# Patient Record
Sex: Male | Born: 1980 | Race: Black or African American | Hispanic: No | Marital: Single | Smoking: Current some day smoker
Health system: Southern US, Community
[De-identification: ages and names within clinical notes are randomized; demographics above are authoritative.]

## PROBLEM LIST (undated history)

## (undated) DIAGNOSIS — F329 Major depressive disorder, single episode, unspecified: Secondary | ICD-10-CM

## (undated) DIAGNOSIS — L732 Hidradenitis suppurativa: Secondary | ICD-10-CM

## (undated) DIAGNOSIS — IMO0002 Reserved for concepts with insufficient information to code with codable children: Secondary | ICD-10-CM

## (undated) DIAGNOSIS — I82409 Acute embolism and thrombosis of unspecified deep veins of unspecified lower extremity: Secondary | ICD-10-CM

## (undated) DIAGNOSIS — R569 Unspecified convulsions: Secondary | ICD-10-CM

## (undated) DIAGNOSIS — F32A Depression, unspecified: Secondary | ICD-10-CM

## (undated) DIAGNOSIS — I739 Peripheral vascular disease, unspecified: Secondary | ICD-10-CM

## (undated) HISTORY — PX: KIDNEY DONATION: SHX685

## (undated) HISTORY — PX: OTHER SURGICAL HISTORY: SHX169

## (undated) HISTORY — PX: TONSILLECTOMY: SUR1361

---
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
## 2003-03-29 HISTORY — PX: KIDNEY SURGERY: SHX687

## 2005-06-28 ENCOUNTER — Emergency Department (HOSPITAL_COMMUNITY): Admission: EM | Admit: 2005-06-28 | Discharge: 2005-06-28 | Payer: Self-pay | Admitting: *Deleted

## 2006-12-02 ENCOUNTER — Ambulatory Visit: Payer: Self-pay | Admitting: Surgery

## 2006-12-02 ENCOUNTER — Inpatient Hospital Stay (HOSPITAL_COMMUNITY): Admission: EM | Admit: 2006-12-02 | Discharge: 2006-12-05 | Payer: Self-pay | Admitting: Emergency Medicine

## 2006-12-04 ENCOUNTER — Ambulatory Visit: Payer: Self-pay | Admitting: Hematology & Oncology

## 2006-12-05 ENCOUNTER — Encounter: Payer: Self-pay | Admitting: Critical Care Medicine

## 2007-03-10 ENCOUNTER — Ambulatory Visit: Payer: Self-pay | Admitting: Critical Care Medicine

## 2007-03-10 DIAGNOSIS — I82409 Acute embolism and thrombosis of unspecified deep veins of unspecified lower extremity: Secondary | ICD-10-CM | POA: Insufficient documentation

## 2008-02-21 ENCOUNTER — Emergency Department (HOSPITAL_COMMUNITY): Admission: EM | Admit: 2008-02-21 | Discharge: 2008-02-21 | Payer: Self-pay | Admitting: Emergency Medicine

## 2008-05-30 ENCOUNTER — Emergency Department (HOSPITAL_COMMUNITY): Admission: EM | Admit: 2008-05-30 | Discharge: 2008-05-30 | Payer: Self-pay | Admitting: Emergency Medicine

## 2009-07-10 ENCOUNTER — Emergency Department (HOSPITAL_COMMUNITY): Admission: EM | Admit: 2009-07-10 | Discharge: 2009-07-10 | Payer: Self-pay | Admitting: Emergency Medicine

## 2010-05-23 LAB — RPR: RPR Ser Ql: NONREACTIVE

## 2010-05-28 LAB — URINALYSIS, ROUTINE W REFLEX MICROSCOPIC
Glucose, UA: NEGATIVE mg/dL
Hgb urine dipstick: NEGATIVE
Ketones, ur: 15 mg/dL — AB
Nitrite: NEGATIVE
Specific Gravity, Urine: 1.041 — ABNORMAL HIGH (ref 1.005–1.030)
Urobilinogen, UA: 1 mg/dL (ref 0.0–1.0)
pH: 6 (ref 5.0–8.0)

## 2010-05-28 LAB — GC/CHLAMYDIA PROBE AMP, GENITAL
Chlamydia, DNA Probe: POSITIVE — AB
GC Probe Amp, Genital: POSITIVE — AB

## 2010-05-28 LAB — URINE CULTURE
Colony Count: NO GROWTH
Culture: NO GROWTH

## 2010-05-28 LAB — URINE MICROSCOPIC-ADD ON

## 2010-05-28 LAB — RPR: RPR Ser Ql: NONREACTIVE

## 2010-06-26 NOTE — H&P (Signed)
NAMECHATHAM, HOWINGTON             ACCOUNT NO.:  [id number]   MEDICAL RECORD NO.:  [id number]          PATIENT TYPE:  EMS   LOCATION:  ED                           FACILITY:  [facility]   PHYSICIAN:  Wilson Singer, M.D.DATE OF BIRTH:  02/03/1981   DATE OF ADMISSION:  12/02/2006  DATE OF DISCHARGE:                              HISTORY & PHYSICAL   The patient is unassigned.   HISTORY:  This is a 30 year old man who has no other medical problems  who noticed swelling and pain in the left leg in the last 24-36 hours.  He denies taking any long journeys on coaches or airline flights and  denies being immobile for long periods of time recently.  He was  discovered to have a left leg deep vein thrombosis and now he is being  admitted for the above.   PAST MEDICAL HISTORY:  No serious illnesses.   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   SOCIAL HISTORY:  He is single and lives with his sisters.  He smokes one  to two cigarettes per day.  He does not drink alcohol.  He is currently  looking for job.   MEDICATIONS:  None.   ALLERGIES:  IODINE.   FAMILY HISTORY:  Noncontributory.   PHYSICAL EXAMINATION:  VITAL SIGNS:  Temperature 101.5 on admission but  now 100.4, blood pressure 148/90, pulse 90, respiratory rate 12,  saturation 99%.  CARDIOVASCULAR:  Heart sounds are present and normal.  There are no murmurs.  RESPIRATORY:  Lung fields are clear.  There is no pleural rub.  ABDOMEN:  Soft and nontender with no hepatosplenomegaly.  I cannot  detect any masses.  NEUROLOGICAL:  He is alert and oriented with no focal neurologic signs.  Examination of his left leg confirms a swollen left leg and warmth on  palpation consistent with the finding of a deep vein thrombosis on  venous Doppler.   INVESTIGATIONS:  Hemoglobin 12.3, white blood cell  count 12.8, platelets  209.  Sodium 135, potassium 3.7, chloride 101, bicarbonate 27, glucose  111, BUN 8, creatinine 1.19, INR 1.2, PTT 30.  Left lower extremity  venous Doppler shows a DVT in the femoral vein and peroneal vein.   IMPRESSION:  Left leg deep venous thrombosis.   PLAN:  1. Admit.  2. Anticoagulate with Lovenox and Coumadin.  3. CT of the abdomen and pelvis to make sure there are no masses      obstructing venous flow.  4. Check hypercoagulopathy panel.   Further recommendations will depend on the patient's hospital progress.      Wilson Singer, M.D.  Electronically Signed     NCG/MEDQ  D:  12/02/2006  T:  12/02/2006  Job:  [id number]

## 2010-06-26 NOTE — Discharge Summary (Signed)
NAMEJALONI, Adam Christian             ACCOUNT NO.:  [id number]   MEDICAL RECORD NO.:  [id number]          PATIENT TYPE:  INP   LOCATION:  1428                         FACILITY:  [facility]   PHYSICIAN:  Lonia Blood, M.D.       DATE OF BIRTH:  Feb 27, 1980   DATE OF ADMISSION:  12/02/2006  DATE OF DISCHARGE:  12/05/2006                               DISCHARGE SUMMARY   PRIMARY CARE PHYSICIAN:  Will become [facility].   DISCHARGE DIAGNOSES:  1. Left femoral vein and peroneal vein deep venous thrombosis.  2. Phlegmasia alba dolens.  3. Decreased level of protein C of unclear significance.  4. 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   DISCHARGE MEDICATIONS:  1. Rivaroxaban, a study medication for anticoagulation purposes.  2. OxyContin sustained release 10 mg twice a day  3. Percocet 5/325 one to two tablets every 6 hours for breakthrough      pain.  4. Senokot twice daily for constipation   CONDITION ON DISCHARGE:  Adam Christian was discharged in fair condition.  At the time of discharge, the patient was told to follow up with  [facility].  The patient has a visit with Dr. Myna Hidalgo,  hematology/oncology, for December 31, 2006, at 10:30 a.m. to discuss the  length of anticoagulation.  The patient was also enrolled EINSTEIN study  by Dr. Shan Levans.   PROCEDURES DURING THIS ADMISSION:  December 02, 2006, the patient  underwent lower extremity venous Dopplers. Findings positive for deep  venous thrombosis in the mid femoral vein down to the popliteal and  peroneal vein.   CONSULTATIONS:  No consultations obtained.   HISTORY AND PHYSICAL:  Refer to the dictated H&P done by Dr. Karilyn Cota on  December 02, 2006.   HOSPITAL COURSE.:  LEFT LOWER EXTREMITY DEEP VENOUS THROMBOSIS:  Adam Christian was admitted  and promptly placed on anticoagulation with Lovenox.  The patient had  significant pain in his left lower extremity, which we felt was  secondary to  phlegmasia alba dolens.  The patient received symptomatic  treatment with opiates and local care.  On December 03, 2006, the patient  was randomized in the EINSTEIN trial of Rivaroxaban anticoagulant for  deep venous  thrombosis.  The patient will receive the study medication for free, and  he will have followup arranged through the study.  Adam Christian was seen  by the physical therapist and occupational therapist, and he was deemed  stable for discharge by December 05, 2006.      Lonia Blood, M.D.  Electronically Signed     SL/MEDQ  D:  12/05/2006  T:  12/06/2006  Job:  [id number]   cc:   Rose Phi. Myna Hidalgo, M.D.  Fax: [phone]   Charlcie Cradle Delford Field, MD, FCCP  [address]  [place]  [place] [address]

## 2010-11-21 LAB — URINALYSIS, ROUTINE W REFLEX MICROSCOPIC
Bilirubin Urine: NEGATIVE
Hgb urine dipstick: NEGATIVE
Specific Gravity, Urine: 1.02
pH: 7.5

## 2010-11-21 LAB — BETA-2-GLYCOPROTEIN I ABS, IGG/M/A: Beta-2 Glyco I IgG: <4 U/mL (ref <20)

## 2010-11-21 LAB — COMPREHENSIVE METABOLIC PANEL
ALT: 17
ALT: 19
AST: 14
Albumin: 2.7 — ABNORMAL LOW
Alkaline Phosphatase: 48
Alkaline Phosphatase: 53
BUN: 6
Calcium: 8.6
Calcium: 9.2
GFR calc Af Amer: >60
Glucose, Bld: 103 — ABNORMAL HIGH
Potassium: 4.1
Potassium: 4.5
Sodium: 133 — ABNORMAL LOW
Sodium: 138
Total Protein: 5.9 — ABNORMAL LOW
Total Protein: 6.8

## 2010-11-21 LAB — BASIC METABOLIC PANEL
BUN: 8
Calcium: 8.8
Chloride: 101
Creatinine, Ser: 1.19
GFR calc Af Amer: >60
GFR calc non Af Amer: >60
Glucose, Bld: 111 — ABNORMAL HIGH

## 2010-11-21 LAB — PROTIME-INR
INR: 1.2
INR: 1.2
Prothrombin Time: 15.2
Prothrombin Time: 15.2

## 2010-11-21 LAB — LUPUS ANTICOAGULANT PANEL
Lupus Anticoagulant: DETECTED — AB
PTT Lupus Anticoagulant: 55.8 — ABNORMAL HIGH (ref 36.3–48.8)

## 2010-11-21 LAB — CBC
HCT: 38.3 — ABNORMAL LOW
HCT: 39.3
HCT: 41.7
Hemoglobin: 12.3 — ABNORMAL LOW
Hemoglobin: 13.1
MCHC: 32.7
MCHC: 33
MCHC: 33.3
MCV: 82.4
MCV: 82.8
Platelets: 194
Platelets: 209
Platelets: 254
Platelets: 286
RBC: 4.77
RDW: 13.5
RDW: 13.6
RDW: 13.9
WBC: 12.8 — ABNORMAL HIGH
WBC: 13.1 — ABNORMAL HIGH

## 2010-11-21 LAB — DIFFERENTIAL
Basophils Absolute: 0
Eosinophils Absolute: 0.1
Eosinophils Relative: 0
Lymphs Abs: 1.6
Monocytes Absolute: 0.9 — ABNORMAL HIGH
Neutro Abs: 10.3 — ABNORMAL HIGH
Neutrophils Relative %: 81 — ABNORMAL HIGH

## 2010-11-21 LAB — PROTEIN S, TOTAL: Protein S Ag, Total: 119 % (ref 70–140)

## 2010-11-21 LAB — ANTITHROMBIN III: AntiThromb III Func: 99 (ref 76–126)

## 2010-11-21 LAB — CARDIOLIPIN ANTIBODIES, IGG, IGM, IGA
Anticardiolipin IgA: <7 — ABNORMAL LOW (ref <13)
Anticardiolipin IgM: <7 — ABNORMAL LOW (ref <10)

## 2010-11-21 LAB — PROTEIN C ACTIVITY: Protein C Activity: 86 % (ref 75–133)

## 2010-11-21 LAB — PROTEIN S ACTIVITY: Protein S Activity: 111 % (ref 69–129)

## 2010-11-21 LAB — HOMOCYSTEINE: Homocysteine: 6.3

## 2010-11-21 LAB — HEPATIC FUNCTION PANEL: Indirect Bilirubin: 0.6

## 2010-11-21 LAB — PROTHROMBIN GENE MUTATION

## 2010-11-21 LAB — FACTOR 5 LEIDEN

## 2010-11-21 LAB — PROTEIN C, TOTAL: Protein C, Total: 51 % — ABNORMAL LOW (ref 70–140)

## 2010-11-21 LAB — APTT: aPTT: 30

## 2010-12-19 ENCOUNTER — Encounter: Payer: Self-pay | Admitting: *Deleted

## 2010-12-19 ENCOUNTER — Emergency Department (HOSPITAL_COMMUNITY)
Admission: EM | Admit: 2010-12-19 | Discharge: 2010-12-20 | Disposition: A | Discharge status: Other Acute Inpatient Hospital | Payer: Self-pay | Attending: Emergency Medicine | Admitting: Emergency Medicine

## 2010-12-19 DIAGNOSIS — R45851 Suicidal ideations: Secondary | ICD-10-CM | POA: Insufficient documentation

## 2010-12-19 DIAGNOSIS — F329 Major depressive disorder, single episode, unspecified: Secondary | ICD-10-CM | POA: Insufficient documentation

## 2010-12-19 DIAGNOSIS — F3289 Other specified depressive episodes: Secondary | ICD-10-CM | POA: Insufficient documentation

## 2010-12-19 DIAGNOSIS — F17200 Nicotine dependence, unspecified, uncomplicated: Secondary | ICD-10-CM | POA: Insufficient documentation

## 2010-12-19 HISTORY — DX: Depression, unspecified: F32.A

## 2010-12-19 HISTORY — DX: Major depressive disorder, single episode, unspecified: F32.9

## 2010-12-19 LAB — COMPREHENSIVE METABOLIC PANEL
Albumin: 3.1 g/dL — ABNORMAL LOW (ref 3.5–5.2)
Alkaline Phosphatase: 76 U/L (ref 39–117)
BUN: 9 mg/dL (ref 6–23)
CO2: 28 mEq/L (ref 19–32)
Chloride: 100 mEq/L (ref 96–112)
Potassium: 3.6 mEq/L (ref 3.5–5.1)
Total Bilirubin: 0.1 mg/dL — ABNORMAL LOW (ref 0.3–1.2)

## 2010-12-19 LAB — RAPID URINE DRUG SCREEN, HOSP PERFORMED
Benzodiazepines: NOT DETECTED
Cocaine: NOT DETECTED
Opiates: NOT DETECTED

## 2010-12-19 LAB — CBC
MCH: 25.9 pg — ABNORMAL LOW (ref 26.0–34.0)
MCV: 81 fL (ref 78.0–100.0)
Platelets: 296 10*3/uL (ref 150–400)
RDW: 14.3 % (ref 11.5–15.5)
WBC: 11.1 10*3/uL — ABNORMAL HIGH (ref 4.0–10.5)

## 2010-12-19 LAB — ACETAMINOPHEN LEVEL: Acetaminophen (Tylenol), Serum: <15 ug/mL (ref 10–30)

## 2010-12-19 MED ORDER — IBUPROFEN 200 MG PO TABS: 400.0000 mg | ORAL_TABLET | Freq: Three times a day (TID) | ORAL | Status: DC | PRN

## 2010-12-19 MED ORDER — ONDANSETRON HCL 4 MG PO TABS: 4.0000 mg | ORAL_TABLET | Freq: Three times a day (TID) | ORAL | Status: DC | PRN

## 2010-12-19 MED ORDER — ZOLPIDEM TARTRATE 5 MG PO TABS
5.0000 mg | ORAL_TABLET | Freq: Every evening | ORAL | Status: DC | PRN
  Administered 2010-12-19: 5 mg via ORAL
  Filled 2010-12-19: qty 1

## 2010-12-19 MED ORDER — NICOTINE 21 MG/24HR TD PT24: 21.0000 mg | MEDICATED_PATCH | Freq: Every day | TRANSDERMAL | Status: DC | PRN

## 2010-12-19 MED ORDER — ALUM & MAG HYDROXIDE-SIMETH 200-200-20 MG/5ML PO SUSP: 30.0000 mL | ORAL | Status: DC | PRN

## 2010-12-19 MED ORDER — ACETAMINOPHEN 325 MG PO TABS: 650.0000 mg | ORAL_TABLET | ORAL | Status: DC | PRN

## 2010-12-19 MED ORDER — LORAZEPAM 1 MG PO TABS: 1.0000 mg | ORAL_TABLET | Freq: Three times a day (TID) | ORAL | Status: DC | PRN

## 2010-12-19 NOTE — ED Notes (Signed)
Attempted to call act team no answer

## 2010-12-19 NOTE — ED Provider Notes (Addendum)
History     CSN: [id number] Arrival date & time: 12/19/2010  5:15 PM     Chief Complaint  Patient presents with  . V70.1    HPI Pt was seen at 1740.  Per pt, c/o gradual onset and worsening of persistent depression and SI for the past 2 weeks. Pt states he "wanted to step out in front of a car" in [place] today, but did not.  He states he called [facility] who brought him to the ED for further eval/admit.  Denies HI, no SA, no hallucinations.     Past Medical History  Diagnosis Date  . Depression     Past Surgical History  Procedure Date  . Kidney donation   . Tonsillectomy      History  Substance Use Topics  . Smoking status: Current Everyday Smoker  . Smokeless tobacco: Not on file  . Alcohol Use: No    Review of Systems ROS: Statement: All systems negative except as marked or noted in the HPI; Constitutional: Negative for fever and chills. ; ; Eyes: Negative for eye pain, redness and discharge. ; ; ENMT: Negative for ear pain, hoarseness, nasal congestion, sinus pressure and sore throat. ; ; Cardiovascular: Negative for chest pain, palpitations, diaphoresis, dyspnea and peripheral edema. ; ; Respiratory: Negative for cough, wheezing and stridor. ; ; Gastrointestinal: Negative for nausea, vomiting, diarrhea and abdominal pain, blood in stool, hematemesis, jaundice and rectal bleeding. . ; ; Genitourinary: Negative for dysuria, flank pain and hematuria. ; ; Musculoskeletal: Negative for back pain and neck pain. Negative for swelling and trauma.; ; Skin: Negative for pruritus, rash, abrasions, blisters, bruising and skin lesion.; ; Neuro: Negative for headache, lightheadedness and neck stiffness. Negative for weakness, altered level of consciousness , altered mental status, extremity weakness, paresthesias, involuntary movement, seizure and syncope.  Psych:  +SI, no SA, no HI, no hallucinations.  Allergies  Iodine and Shrimp  Home Medications  No current outpatient  prescriptions on file.  BP 128/82  Pulse 61  Temp(Src) 97.6 F (36.4 C) (Oral)  Resp 18  Wt 161 lb 13.1 oz (73.4 kg)  SpO2 100%  Physical Exam 1745: Physical examination:  Nursing notes reviewed; Vital signs and O2 SAT reviewed;  Constitutional: Well developed, Well nourished, Well hydrated, In no acute distress; Head:  Normocephalic, atraumatic; Eyes: EOMI, PERRL, No scleral icterus; ENMT: Mouth and pharynx normal, Mucous membranes moist; Neck: Supple, Full range of motion, No lymphadenopathy; Cardiovascular: Regular rate and rhythm, No murmur, rub, or gallop; Respiratory: Breath sounds clear & equal bilaterally, No rales, rhonchi, wheezes, or rub, Normal respiratory effort/excursion; Chest: Nontender, Movement normal; Extremities: Pulses normal, No tenderness, No edema, No calf edema or asymmetry.; Neuro: AA&Ox3, Major CN grossly intact.  No gross focal motor or sensory deficits in extremities.; Skin: Color normal, Warm, Dry; Psych:  Affect flat, poor eye contact, +SI.    ED Course  Procedures   MDM  MDM Reviewed: nursing note and vitals Interpretation: labs   Results for orders placed during the hospital encounter of 12/19/10  URINE RAPID DRUG SCREEN (HOSP PERFORMED)      Component Value Range   Opiates NONE DETECTED  NONE DETECTED    Cocaine NONE DETECTED  NONE DETECTED    Benzodiazepines NONE DETECTED  NONE DETECTED    Amphetamines NONE DETECTED  NONE DETECTED    Tetrahydrocannabinol POSITIVE (*) NONE DETECTED    Barbiturates NONE DETECTED  NONE DETECTED   ACETAMINOPHEN LEVEL      Component  Value Range   Acetaminophen (Tylenol), Serum <15.0  10 - 30 (ug/mL)  ETHANOL      Component Value Range   Alcohol, Ethyl (B) <11  0 - 11 (mg/dL)  COMPREHENSIVE METABOLIC PANEL      Component Value Range   Sodium 137  135 - 145 (mEq/L)   Potassium 3.6  3.5 - 5.1 (mEq/L)   Chloride 100  96 - 112 (mEq/L)   CO2 28  19 - 32 (mEq/L)   Glucose, Bld 88  70 - 99 (mg/dL)   BUN 9  6 - 23  (mg/dL)   Creatinine, Ser 9.56  0.50 - 1.35 (mg/dL)   Calcium 8.9  8.4 - 21.3 (mg/dL)   Total Protein 7.8  6.0 - 8.3 (g/dL)   Albumin 3.1 (*) 3.5 - 5.2 (g/dL)   AST 12  0 - 37 (U/L)   ALT 12  0 - 53 (U/L)   Alkaline Phosphatase 76  39 - 117 (U/L)   Total Bilirubin 0.1 (*) 0.3 - 1.2 (mg/dL)   GFR calc non Af Amer >90  >90 (mL/min)   GFR calc Af Amer >90  >90 (mL/min)  CBC      Component Value Range   WBC 11.1 (*) 4.0 - 10.5 (K/uL)   RBC 4.59  4.22 - 5.81 (MIL/uL)   Hemoglobin 11.9 (*) 13.0 - 17.0 (g/dL)   HCT 08.6 (*) 57.8 - 52.0 (%)   MCV 81.0  78.0 - 100.0 (fL)   MCH 25.9 (*) 26.0 - 34.0 (pg)   MCHC 32.0  30.0 - 36.0 (g/dL)   RDW 46.9  62.9 - 52.8 (%)   Platelets 296  150 - 400 (K/uL)    1900:  T/C to ACT team, case discussed, including:  HPI, pertinent PM/SHx, VS/PE, dx testing, ED course and treatment.  Agreeable to eval in ED.        [facility]    Laray Anger, DO 12/20/10 0208  Pt stable overnight, awaiting assessment by ACT.  Olivia Mackie, MD 12/20/10 [phone]

## 2010-12-19 NOTE — ED Notes (Signed)
Report given to Nicole.

## 2010-12-19 NOTE — ED Notes (Signed)
Luciano Cutter Act made aware of consult

## 2010-12-19 NOTE — ED Notes (Signed)
Patient was given a sprite and sandwich

## 2010-12-19 NOTE — ED Notes (Signed)
Patient given a sprite

## 2010-12-19 NOTE — Progress Notes (Addendum)
Assessment Note   Adam Christian is an 30 y.o. male. Pt was brought to the ED by [facility]. Pt reports feeling suicidal with a plan to step out in traffic. He expresses feeling depressed, hopeless, worthless, guilt, loss of interest in the things that use to make him happy, and problems sleeping. Pt has one past SI attempt by OD.  He has no history of inpatient hospitalizations. Pt denies HI AVH. Pt is unable to contract for safety at this time.  Patient does not have a current provider and is no seeing anyone for treatment at this time.  Patient current stressors include a recent break-up with his girlfriend who kicked him out of the home which forced him to move back in with his mother. Patient reports having a lot of friends, yet still feeling alone. He reports not having any direction in life and wants to give up ant times. He describes his SI thoughts as strong frustrating thoughts that have increased over the last week. Patient reports having this feelings since he was 30 years old. He is a current user of Marijuana and smokes 2 blunts per day for years with last use today.   Patient is seeking inpatient treatment for stabilization and is willing to go voluntary. This Clinical research associate spoke with the EDP who agrees that patient needs inpatient treatment to prevent any harm done to self. The writer has sent referral to [facility] for review. All parties have been notified of the disposition as it stands at this time.   Disposition: Please RUN   Axis I: Depressive D/O NOS Axis II: Deferred Axis III: History reviewed. No pertinent past medical history. Axis IV: economic problems, problems related to social environment and problems with primary support group Axis V: 31-40 impairment in reality testing  Past Medical History: History reviewed. No pertinent past medical history.  Past Surgical History  Procedure Date  . Kidney donation   . Tonsillectomy     Family History: History reviewed. No pertinent  family history.  Social History:  reports that he has been smoking.  He does not have any smokeless tobacco history on file. He reports that he uses illicit drugs (Marijuana). He reports that he does not drink alcohol.  Allergies:  Allergies  Allergen Reactions  . Iodine   . Shrimp (Shellfish Allergy)     Home Medications:  Medications Prior to Admission  Medication Dose Route Frequency Provider Last Rate Last Dose  . acetaminophen (TYLENOL) tablet 650 mg  650 mg Oral Q4H PRN Laray Anger, DO      . alum & mag hydroxide-simeth (MAALOX/MYLANTA) 200-200-20 MG/5ML suspension 30 mL  30 mL Oral PRN Laray Anger, DO      . ibuprofen (ADVIL,MOTRIN) tablet 400 mg  400 mg Oral Q8H PRN Laray Anger, DO      . LORazepam (ATIVAN) tablet 1 mg  1 mg Oral Q8H PRN Laray Anger, DO      . nicotine (NICODERM CQ - dosed in mg/24 hours) patch 21 mg  21 mg Transdermal Daily PRN Laray Anger, DO      . ondansetron [facility]) tablet 4 mg  4 mg Oral Q8H PRN Laray Anger, DO      . zolpidem [facility]) tablet 5 mg  5 mg Oral QHS PRN Laray Anger, DO   5 mg at 12/19/10 2236   No current outpatient prescriptions on file as of 12/19/2010.    OB/GYN Status:  No LMP for  male patient.  General Assessment Data Living Arrangements: Parent (Lives w/ mother) Can pt return to current living arrangement?: Yes Admission Status: Voluntary Is patient capable of signing voluntary admission?: Yes Transfer from: Home Referral Source: MD  Risk to self Suicidal Ideation: Yes-Currently Present Suicidal Intent: Yes-Currently Present Is patient at risk for suicide?: Yes Suicidal Plan?: Yes-Currently Present (Walk into traffic) Specify Current Suicidal Plan:  (Walk into traffic) Access to Means: Yes Specify Access to Suicidal Means: yes What has been your use of drugs/alcohol within the last 12 months?:  (use drugs daily) Other Self Harm Risks: none Triggers for Past Attempts:  Unknown Intentional Self Injurious Behavior: None Factors that decrease suicide risk: Sense of responsibility to family Family Suicide History: No Recent stressful life event(s): Other (Comment) (recent breakup w/ ex-girlfriend, no job/income) Persecutory voices/beliefs?: No Depression: Yes Depression Symptoms: Loss of interest in usual pleasures;Feeling worthless/self pity;Feeling angry/irritable;Guilt Substance abuse history and/or treatment for substance abuse?: Yes Suicide prevention information given to non-admitted patients: Not applicable  Risk to Others Homicidal Ideation: No Thoughts of Harm to Others: No Current Homicidal Intent: No Current Homicidal Plan: No Access to Homicidal Means: No Identified Victim: none History of harm to others?: No Assessment of Violence: None Noted Violent Behavior Description: none Does patient have access to weapons?: No Criminal Charges Pending?: No Does patient have a court date: No  Mental Status Report Appear/Hygiene: Other (Comment) (appropriate) Eye Contact: Fair Motor Activity: Freedom of movement Speech: Logical/coherent Level of Consciousness: Alert Mood: Depressed Affect: Depressed Anxiety Level: Minimal Thought Processes: Coherent;Relevant Judgement: Impaired Orientation: Person;Place;Time;Situation Obsessive Compulsive Thoughts/Behaviors: None  Cognitive Functioning Concentration: Normal Memory: Recent Intact;Remote Intact IQ: Average Insight: Poor Impulse Control: Fair Appetite: Good Weight Loss:  (none) Weight Gain:  (none) Sleep: Decreased Total Hours of Sleep: 5  Vegetative Symptoms: None  Prior Inpatient/Outpatient Therapy Prior Therapy Dates: n/a Prior Therapy Facilty/Provider(s): n/a Reason for Treatment: n/a            Values / Beliefs Cultural Requests During Hospitalization: None Spiritual Requests During Hospitalization: None        Additional Information 1:1 In Past 12 Months?:  No CIRT Risk: No Elopement Risk: No Does patient have medical clearance?: Yes  Child/Adolescent Assessment Running Away Risk: Denies Bed-Wetting: Denies Destruction of Property: Denies Cruelty to Animals: Denies Stealing: Denies Rebellious/Defies Authority: Denies Satanic Involvement: Denies Archivist: Denies Problems at [employer]: Denies Gang Involvement: Denies  Disposition:  Disposition Disposition of Patient: Referred to [facility]) Patient referred to: Other (Comment)  On Site Evaluation by:   Reviewed with Physician:     Shara Blazing [facility] 12/19/2010 11:03 PM  Patient has been accepted to [facility] to Dr. Allena Katz, Bed 508-1.  Patient's support paperwork has been completed. EDP notified and is in agreement with disposition. EDP will discharge pt to [facility]. Pt nurse notified as well. ALL appropriate paperwork completed and forwarded to [facility] for review.   Ileene Hutchinson , MSW, LCSWA 12/20/2010  7:57 AM

## 2010-12-19 NOTE — ED Notes (Signed)
Pt reports that he has had thoughts of suicide since the age of 74 reports that over the past 2wks worsening thoughts. Pt reports that he spoke with his sisters 2wks ago when he thought of stepping out into traffic pt reports that he had those same thoughts today at the bus stop and called mobile crisis, reports that she brought him here to be evaluated and is currently looking for placement

## 2010-12-19 NOTE — ED Notes (Signed)
Pt states "I've always had thoughts of hurting myself, but in the last 2 wks have talked to my family about it, called the Crisis Team today, I would jump out in front of a car or something"; pt denies hearing voices, Crisis Team member sitting with pt.

## 2010-12-19 NOTE — ED Notes (Signed)
One bag locked in activity room 

## 2010-12-20 ENCOUNTER — Encounter (HOSPITAL_COMMUNITY): Payer: Self-pay | Admitting: Emergency Medicine

## 2010-12-20 ENCOUNTER — Inpatient Hospital Stay (HOSPITAL_COMMUNITY)
Admission: AD | Admit: 2010-12-20 | Discharge: 2010-12-24 | DRG: 885 | Disposition: A | Discharge status: Home / Self Care | Payer: PRIVATE HEALTH INSURANCE | Source: Ambulatory Visit | Attending: Psychiatry | Admitting: Psychiatry

## 2010-12-20 ENCOUNTER — Encounter (HOSPITAL_COMMUNITY): Payer: Self-pay | Admitting: Behavioral Health

## 2010-12-20 DIAGNOSIS — R45851 Suicidal ideations: Secondary | ICD-10-CM

## 2010-12-20 DIAGNOSIS — IMO0002 Reserved for concepts with insufficient information to code with codable children: Secondary | ICD-10-CM

## 2010-12-20 DIAGNOSIS — Z91013 Allergy to seafood: Secondary | ICD-10-CM

## 2010-12-20 DIAGNOSIS — F411 Generalized anxiety disorder: Secondary | ICD-10-CM

## 2010-12-20 DIAGNOSIS — F329 Major depressive disorder, single episode, unspecified: Principal | ICD-10-CM

## 2010-12-20 DIAGNOSIS — G4700 Insomnia, unspecified: Secondary | ICD-10-CM

## 2010-12-20 HISTORY — DX: Reserved for concepts with insufficient information to code with codable children: IMO0002

## 2010-12-20 LAB — CBC
HCT: 36.3 % — ABNORMAL LOW (ref 39.0–52.0)
Hemoglobin: 11.6 g/dL — ABNORMAL LOW (ref 13.0–17.0)
MCH: 25.7 pg — ABNORMAL LOW (ref 26.0–34.0)
MCHC: 32 g/dL (ref 30.0–36.0)
MCV: 80.3 fL (ref 78.0–100.0)
RDW: 14.5 % (ref 11.5–15.5)

## 2010-12-20 MED ORDER — CITALOPRAM HYDROBROMIDE 20 MG PO TABS
20.0000 mg | ORAL_TABLET | Freq: Every day | ORAL | Status: DC
  Administered 2010-12-20 – 2010-12-23 (×4): 20 mg via ORAL
  Filled 2010-12-20 (×5): qty 1

## 2010-12-20 MED ORDER — ALUM & MAG HYDROXIDE-SIMETH 200-200-20 MG/5ML PO SUSP: 30.0000 mL | ORAL | Status: DC | PRN

## 2010-12-20 MED ORDER — DOXYCYCLINE HYCLATE 100 MG PO TABS
100.0000 mg | ORAL_TABLET | Freq: Two times a day (BID) | ORAL | Status: DC
  Administered 2010-12-20 – 2010-12-24 (×8): 100 mg via ORAL
  Filled 2010-12-20 (×9): qty 1

## 2010-12-20 MED ORDER — MAGNESIUM HYDROXIDE 400 MG/5ML PO SUSP: 30.0000 mL | Freq: Every day | ORAL | Status: DC | PRN

## 2010-12-20 MED ORDER — TRAZODONE HCL 50 MG PO TABS: 50.0000 mg | ORAL_TABLET | Freq: Every evening | ORAL | Status: DC | PRN

## 2010-12-20 MED ORDER — ACETAMINOPHEN 325 MG PO TABS
650.0000 mg | ORAL_TABLET | Freq: Four times a day (QID) | ORAL | Status: DC | PRN
Start: 2010-12-20 — End: 2010-12-24

## 2010-12-20 MED ORDER — DIPHENHYDRAMINE HCL 25 MG PO CAPS
50.0000 mg | ORAL_CAPSULE | Freq: Every evening | ORAL | Status: DC | PRN
  Administered 2010-12-20 – 2010-12-23 (×4): 50 mg via ORAL

## 2010-12-20 NOTE — ED Notes (Signed)
Assumed care of pt. From night shift. 

## 2010-12-20 NOTE — ED Notes (Signed)
Environment secured.pt asleep, resting comfortably. Will continue to monitor for safety. 

## 2010-12-20 NOTE — ED Notes (Signed)
BP 112/70  Pulse 76  Temp(Src) 97.8 F (36.6 C) (Oral)  Resp 18  Wt 161 lb 13.1 oz (73.4 kg)  SpO2 96%  Delayed entry.  Pt resting comfortably this morning. No complaints. Accepted at [facility]. Dr. Allena Katz.   Forbes Cellar, MD 12/20/10 1139

## 2010-12-20 NOTE — Progress Notes (Addendum)
Psychiatric Admission Assessment Adult  Patient Identification:  Adam Christian Date of Evaluation:  12/20/2010 Chief Complaint:  DEPRESSIVE D/O NOS History of Present Illness:: 30yoSAAM who called mobile crisis for recurrent thoughts to step into traffic. Recently told his family how alone he usually feels despite having 9 sisters. He has had no attempts or gestures but has vividly seen himself steeping off a curb into traffic. Was actually at the bus stop with his mom the other day and because the feeling was so strong he called the mobile crisis unit.  Mood Symptoms:  Depression Depression Symptoms:  depressed mood, insomnia, feelings of worthlessness/guilt and suicidal thoughts with specific plan (Hypo) Manic Symptoms:   Elevated Mood:  No Irritable Mood:  No Grandiosity:  No Distractibility:  No Lability of Mood:  Yesaccording to him  Delusions:  No Hallucinations:  No Impulsivity:  No Sexually Inappropriate Behavior:  No Financial Extravagance:  No Flight of Ideas:  No  Anxiety Symptoms: Excessive Worry:  No Panic Symptoms:  No Agoraphobia:  No Obsessive Compulsive: No  Symptoms: None Specific Phobias:  No Social Anxiety:  No  Psychotic Symptoms:  Hallucinations:  None Delusions:  No Paranoia:  No   Ideas of Reference:  No  PTSD Symptoms: Ever had a traumatic exposure:  No Had a traumatic exposure in the last month:  No Re-experiencing:  None Hypervigilance:  No Hyperarousal:  Sleep None Avoidance:  None  Traumatic Brain Injury:  None   Past Psychiatric History: Diagnosis:seen for add ages [age] in [place]   Hospitalizations:none for psych   2005 [facility] donated a Kidney to his mom  Do not see a scar   Outpatient Care:just Ritalin ages [age]   Substance Abuse Care:none   Self-Mutilation:none   Suicidal Attempts:none none   Violent Behaviors:   Past Medical History:  Cyst L axillae is draining and we took a C&S to rule out MRSA Past Medical History    Diagnosis Date  . Depression   . Cyst 30 years old was first occurance    Pt has multiple cysts: L axila, groing, gluteous, scrotum   History of Loss of Consciousness:  No Seizure History:  Yes age 70 once no meds  Cardiac History:  No Allergies:  NKDA allergic to shrimp  Allergies  Allergen Reactions  . Iodine   . Shrimp (Shellfish Allergy) Swelling    Tongue swells   Current Medications:  Current Facility-Administered Medications  Medication Dose Route Frequency Provider Last Rate Last Dose  . acetaminophen (TYLENOL) tablet 650 mg  650 mg Oral Q6H PRN Franchot Gallo, MD      . alum & mag hydroxide-simeth (MAALOX/MYLANTA) 200-200-20 MG/5ML suspension 30 mL  30 mL Oral Q4H PRN Franchot Gallo, MD      . citalopram (CELEXA) tablet 20 mg  20 mg Oral QHS Mickie D. Adams, PA      . diphenhydrAMINE (BENADRYL) capsule 50 mg  50 mg Oral QHS PRN Mickie D. Adams, PA      . doxycycline (VIBRA-TABS) tablet 100 mg  100 mg Oral Q12H Mechele Dawley, NP   100 mg at 12/20/10 1449  . magnesium hydroxide (MILK OF MAGNESIA) suspension 30 mL  30 mL Oral Daily PRN Franchot Gallo, MD      . DISCONTD: traZODone (DESYREL) tablet 50 mg  50 mg Oral QHS PRN Franchot Gallo, MD       Facility-Administered Medications Ordered in Other Encounters  Medication Dose Route Frequency Provider Last Rate Last Dose  .  DISCONTD: acetaminophen (TYLENOL) tablet 650 mg  650 mg Oral Q4H PRN Laray Anger, DO      . DISCONTD: alum & mag hydroxide-simeth (MAALOX/MYLANTA) 200-200-20 MG/5ML suspension 30 mL  30 mL Oral PRN Laray Anger, DO      . DISCONTD: ibuprofen (ADVIL,MOTRIN) tablet 400 mg  400 mg Oral Q8H PRN Laray Anger, DO      . DISCONTD: LORazepam (ATIVAN) tablet 1 mg  1 mg Oral Q8H PRN Laray Anger, DO      . DISCONTD: nicotine (NICODERM CQ - dosed in mg/24 hours) patch 21 mg  21 mg Transdermal Daily PRN Laray Anger, DO      . DISCONTD: ondansetron [facility]) tablet 4 mg  4 mg Oral Q8H PRN  Laray Anger, DO      . DISCONTD: zolpidem (AMBIEN) tablet 5 mg  5 mg Oral QHS PRN Laray Anger, DO   5 mg at 12/19/10 2236    Previous Psychotropic Medications:  Medication Dose  Took Ritalin age [age]                       Substance Abuse History in the last 12 months: Substance Age of 1st Use Last Use Amount Specific Type  Nicotine      Alcohol      Cannabis  Yesterday  BID   Opiates      Cocaine      Methamphetamines      LSD      Ecstasy      Benzodiazepines      Caffeine      Inhalants      Others:                         Medical Consequences of Substance Abuse:none   Legal Consequences of Substance Abuse:none   Family Consequences of Substance Abuse:none   Blackouts:  No DT's:  No Withdrawal Symptoms:  None  Social History: Current Place of Residence: Living with his mom sister her BF and baby   Place of Birth:   Family Members: Marital Status:  Single Children:  Sons:  Daughters: Relationships: Education:  At [employer] third semester for [employer] Problems/Performance: Religious Beliefs/Practices: History of Abuse (Emotional/Phsycial/Sexual)none  Teacher, music History:  None  Legal History:denies  Hobbies/Interests:  Family History:   Family History  Problem Relation Age of Onset  . Kidney failure Mother     Mental Status Examination/Evaluation: Objective:  Appearance: Fairly Groomed  Patent attorney::  Good  Speech:  Clear and Coherent  Volume:  Normal  Mood:  He reports frequents mood changes during the day   Affect:  Full Range  Thought Process:  Linear  Orientation:  Full  Thought Content:  No psychosis  Suicidal Thoughts:  Yes.  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 Depression   . Cyst 30 years old was  first occurance    Pt has multiple cysts: L axila, groing, gluteous, scrotum   Anaemia and elevated WBC with multiple cysts?   AXIS IV economic problems and educational problems  AXIS V 51-60 moderate symptoms   Treatment Plan/Recommendations:  Treatment Plan Summary: Daily contact with patient to assess and evaluate symptoms and progress in treatment Medication management Start Celexa 20 mg at hs for depression/sleep            Benadryl 50 mg at hs sleep pt is light sleeper and has poor sleep habits  Patient was examined and agree with findings in ED  UDS +THC CBC showed anemia and elevated WBC  Observation Level/Precautions:  15 min checks   Laboratory:  C&S for MRSA is pending -drainage from L axillae   Psychotherapy:  Group therapy   Medications: Celexa 20 mg at hs and Benedryl 50 mg at hs    Routine PRN Medications:  Yes  Consultations: none    Discharge Concerns:    Other:  Will need counselling and med management     ADAMS,MICKIE D. 11/8/20126:37 PM

## 2010-12-20 NOTE — ED Notes (Signed)
Pt accepted to [facility] per [place] act reports that pt can go after 8am today

## 2010-12-20 NOTE — Progress Notes (Addendum)
This is the first admission for this 30 yr old male admitted voluntarily with suicidal ideation. Pt had a plan to step in front of a vehicle. Pt denies SI/HI at this time, but states that he has had passive SI since he was a teenager. Pt does contract for safety. Pt affect is bright and mood is appropriate. Pt offered food and ate lunch. Pt has a hx of marijuana use but no ETOH. Pt states that he recently broke up with his girlfriend and had to move in with his mother. Pt placed on orange precautions due to open abscesses on scrotum, left axilla and gluteus. Pt has had cysts since he was 30 yrs old. Infection control nurse notified. Pt was oriented to the unit and given pt handbook. 15 minute checks initiated for safety.  Writer contacted infection control nurse for instruction in dealing with pt's cysts. Writer received order from MD for wound culture and other labs. Clean dry dressing applied to prevent drainage. According to infection control, pt can participate in milieu as long as wound is covered and prevents drainage from seeping through clothing. MHT washed clothes and pt showered before writer applied dressing to left arm/axilla.

## 2010-12-20 NOTE — ED Notes (Signed)
Called report to Minerva Areola, Charity fundraiser at [facility].   Pt. Is vol. Called [facility] to transport pt. And 1 bag of belongings to [facility].   Pt. gv

## 2010-12-21 DIAGNOSIS — F339 Major depressive disorder, recurrent, unspecified: Secondary | ICD-10-CM

## 2010-12-21 DIAGNOSIS — F411 Generalized anxiety disorder: Secondary | ICD-10-CM

## 2010-12-21 NOTE — Progress Notes (Signed)
[facility] Group Notes:  (Counselor/Nursing/MHT/Case Management/Adjunct)  12/21/2010 3:11 PM  Type of Therapy:  Exploration  Participation Level:  Active  Participation Quality:  Appropriate, Attentive and Sharing  Affect:  Blunted  Cognitive:  Alert and Appropriate  Insight:  Good  Engagement in Group:  Good  Engagement in Therapy:  Good  Modes of Intervention:  Support and Exploration  Summary of Progress/Problems: Adam Christian explored concept of relapse and identified that he is trying not to relapse into being angry all the time. He was able to identify that his family being "argumentative," his history of engaging in fights, and his way of thinking all put him at risk of relapsing into this, but was also able to identify positive ways to cope with the anger, such as taking a walk around his neighborhood and listening to music. Adam Christian also processed his risk factors for suicide as well as protective factors, before being called out of group to meet with the doctor.  Billie Lade 12/21/2010, 3:11 PM

## 2010-12-21 NOTE — Progress Notes (Signed)
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 Appetite: Yes, AEB: Good Appetite.   Mild>(1-10) >Severe  Hopelessness (1-10): 3  Depression (1-10): 3-4 currently.  Reports 8-9 prior to admission. Anxiety (1-10): 2-3   Suicidal Ideation: Denies current suicidal ideations but were present prior to admission.  Plan: No  Intent: No  Means: No  Homicidal Ideation: Adamantly denies any Homicidal Ideations.  Plan: No  Intent: No  Means: No  Mental Status: AO x 3 General Appearance /Behavior: Neat and Casual  Eye Contact: Good Motor Behavior: Normal  Speech: Normal  Level of Consciousness: Alert  Mood: Mild to Moderately Depressed today.  Affect: Mildly Constricted.  Anxiety Mild Anxiety Reported.  Thought Process: Coherent  Thought Content: WNL with no auditory or visual hallucinations or delusional thinking.  Perception: Normal  Judgment: Good  Insight: Good.  Cognition: Orientation time, place and person   PLAN OF CARE:  Treatment Plan Summary:  1. Daily contact with patient to assess and evaluate symptoms and progress in treatment  2. Medication management  3. The patient will deny suicidal ideations for 48 hours prior to discharge and have a depression and anxiety rating of 3 or less.   Plan:  1. Continue current medications.  2. Continue to monitor.   SUICIDE RISK:   Mild:  Suicidal ideation of limited frequency, intensity, duration, and specificity.  There are no identifiable plans, no associated intent, mild dysphoria and related symptoms, good self-control (both objective and subjective assessment), few other risk factors, and  identifiable protective factors, including available and accessible social support.   Adam Christian 12/21/2010, 1:28 PM

## 2010-12-21 NOTE — Progress Notes (Addendum)
  Pt. Affect appropriate and pt. Interactive with staff and peers. Pt. Reports no pain and denies SI/HI and reports no A/V hallucinations.  Pt. Cont. On q 15 min. Observations for safety and is safe at this time. Wound area underneath Left armpit, cont. To be open, red, and draining small amount of fluid.  Gauze reapplied with tape and curlex to stabilize.  Pt. Reports "no Pain" and states he is trying to "keep it clean, and covered.  Pt. Is compliant with precautions and is cooperative with dressing change. Support offered and pt. Verbalizing appreciation.

## 2010-12-21 NOTE — Plan of Care (Signed)
Problem: Consults Goal: Concurrent Medical Patient Education (See Patient Education Module for education specifics)  Pt. Has boils under left arm that have yellowish colored drainage.   Problem: [facility] Concurrent Medical Problem Goal: STG-Vital signs will be within defined limits or stabilized (STG- Vital signs will be within defined limits or stabilized for individual)  VS to be taken as physician order indicates   Comments:  Boils under pt. Left arm has been cultured, pending labs. Per report labs also in scrotal area.

## 2010-12-21 NOTE — Progress Notes (Signed)
Counselor contacted Adam Christian's sister, Gloris Manchester, to provide suicide prevention education. Sister reported that she is currently at work and will not be able to discuss this matter until late this evening. Since there will not be a counselor available at that time when she visits Adam Christian, counselor briefly explained that a suicide prevention pamphlet along with the contact number for mobile crisis unit at [facility] will be left for her at the front desk when she comes to visit tonight. Counselor encouraged Traci to read the pamphlet and call tomorrow to discuss or if she has any questions. Counselor also went over suicide risks, warning signs, and crisis contact information with [place]. He understands his risk factors and who to contact if he experiences warning signs.   Billie Lade

## 2010-12-21 NOTE — Progress Notes (Signed)
  Pt. Goes to [employer]. Pt. Reports that he has been lonely since ago 17. "I have nine sisters and I always tired please everybody, keep a smiling face, but I was lonely." "I can be in a room full of people and still be lonely"  "I think what brought me to this thinking was I finally thought I found someone I could be with and she broke up with me about two months ago." I was in school and use to help her with her kids using my student money." "I was in school for [employer], but I didn't do well in my last two course trying to help her, I missed classes" Pt. Denies SHI, no pain. Pt. Has boils under left arm draining, writer redressed. Staff will continue to monitor q24min for safety.

## 2010-12-21 NOTE — Progress Notes (Addendum)
Patient seen during d/c planning group and treatment team. He reports feeling depressed and unsafe prior to admission and contacting mobile crisis.  He currently advised of doing well and denies SI/HI and rates depression at three/four. He plans to return to his mother's home at discharge and will be referred to [facility] for outpatient follow up.  Patient will discharge to his mother's home.

## 2010-12-21 NOTE — Progress Notes (Signed)
Interdisciplinary Treatment Plan Update (Adult)  Date:  12/21/2010 Time Reviewed:  10:30 AM  Progress in Treatment: Attending groups: Yes. Participating in groups:  Yes. and As evidenced by:  openness in aftercare planning group setting. Taking medication as prescribed:  Yes. Tolerating medication:  Yes. Family/Significant othe contact made:  No, will contact:  sister, Traci Patient understands diagnosis:  No, unaware of diagnosis for his symptoms Discussing patient identified problems/goals with staff:  Yes. and As evidenced by:  exploration of goals with counselor Medical problems stabilized or resolved:  Yes. Denies suicidal/homicidal ideation: Yes. Issues/concerns per patient self-inventory:  No. Other:  New problem(s) identified: No, Describe:  Pt has just arrived - identifying initial goals.  Reason for Continuation of Hospitalization: Anxiety Depression Medication stabilization Other; describe feelings of chronic loneliness  Interventions implemented related to continuation of hospitalization:  Treatment in process and psychoeducational groups, medication evaluation and adjustment, aftercare planning and follow up appointments to be made, and safety checks every 15 minutes.  Additional comments:  Estimated length of stay: 3-5 days  Discharge Plan: Able to return to mother's home and follow up in [facility] to be determined by [place] and Sports coach.  New goal(s): None  Review of initial/current patient goals per problem list:   1.  Goal(s): Demonstrate decreased signs of depression  Met:  No  Target date: by discharge   As evidenced by: [name] reporting decrease of depression from 12 to 4  2.  Goal (s): Reduce/eliminate suicidal thoughts  Met:  Yes  Target date: by discharge  As evidenced by: [name] reports no suicidal thoughts today  3.  Goal(s): Understand relationship between substance (marijuana) use and depresion  Met:  No  Target date: by  discharge  As evidenced by: [name] acknowledging the role of marijuana use in his struggles.   Attendees: Patient:  Adam Christian 11/9/201210:30 AM  Family:   11/9/201210:30 AM  Physician:  Franchot Gallo, MD 11/9/201210:30 AM  Nursing:   Manuela Schwartz, RN 11/9/201210:30 AM  Case Manager:  Juline Patch, LCSW 11/9/201210:30 AM  Counselor:  Angus Palms, LCSW 11/9/201210:30 AM  Other:  Landry Corporal, NP 11/9/201210:30 AM  Other:   11/9/201210:30 AM  Other:   11/9/201210:30 AM  Other:   11/9/201210:30 AM   Scribe for Treatment Team:   Billie Lade, 12/21/2010, 10:30 AM

## 2010-12-22 NOTE — Progress Notes (Signed)
Pt. Attended and participated in aftercare planning group. Suicide prevention information was provided to pt as well as warning signs to look for with suicide and crisis line numbers to use. Pt. Was very attentive in aftercare planning group and interested in the Suicide Prevention information. Pt. Was admitted to [facility] yesterday 12/21/10. Pt. Stated that he is at [facility] for SI and that he used the mobile crisis hotline before coming to the hospital. Pt. Stated that so far his medications are working for him.

## 2010-12-22 NOTE — Progress Notes (Signed)
  Adam Christian is up and about on the adult milieu. He states he is doing well. Sleep has been good, appetite good. Depression 3 (1-10) , Anxiety 2 (1-10). Hopelessness 0. Having no mood swings on Celexa. Sleeping well with Benadryl.  Denies any suicidal or homiical thoughts Denies any psychotic symptoms.  Reports no pain or increase in drainage from abscesses. Keeping it covered.  Plan: Continue with Celexa           Continue with Doxyclycline            Will need f/u for wound care

## 2010-12-22 NOTE — Progress Notes (Signed)
Nursing Note: Pt's wound was changed twice today. Needed to be changed after playing basketball. Area under left arm pit has several boils one that is draining. (please see skin assessment). Has attended the groups and interacts with his peers appropriately. Also states that he has some areas on his scrotum, which was not observed by this Clinical research associate. Denies SI and HI. Given support, reassurance and praise.

## 2010-12-22 NOTE — Progress Notes (Signed)
  Report received from Edison Simon RN. Writer entered pt's room and observed pt lying in bed asleep eyes closed resp. Even and unlabored, no signs of distress noted. Safety maintained on unit, will continue to monitor.

## 2010-12-23 LAB — CULTURE, ROUTINE-ABSCESS: Gram Stain: NONE SEEN

## 2010-12-23 NOTE — Progress Notes (Signed)
[facility] Group Notes:  (Counselor/Nursing/MHT/Case Management/Adjunct)  12/23/2010 3:37 PM  Type of Therapy:  Counseling group  Participation Level:  Active  Participation Quality:  Appropriate  Affect:  Appropriate  Cognitive:  Appropriate  Insight:  Good  Engagement in Group:  Good  Engagement in Therapy:  Good  Modes of Intervention:  Clarification, Problem-solving and Socialization  Summary of Progress/Problems: Pt. participated in group discussion on how identify goals needed to make positive changes. Support systems and how they influence their goals for change were discussed during the group. Pt. Spoke about getting rid of his anger and about communication problems with some of his family. The pt. spoke about his sister who he considers a support.  Adam Christian [place] 12/23/2010, 3:37 PM

## 2010-12-23 NOTE — Progress Notes (Signed)
[facility] Group Notes:  (Counselor/Nursing/MHT/Case Management/Adjunct)  12/22/10  9:53 AM  Type of Therapy:  Counseling group  Participation Level:  Did Not Attend    Neila Gear 12/22/10, 9:53 AM

## 2010-12-23 NOTE — Progress Notes (Signed)
  Pt. reported that  he has had a good day and voiced no complaints. Pt reported that he has no pain, -si/a/v hall. Scheduled and prn meds were discussed with pt and pt was agreeable to taking his celexa and requested a prn benadryl. Safety maintained on unit, will continue to monitor.

## 2010-12-23 NOTE — Progress Notes (Signed)
[facility] MD Progress Note  12/23/2010 10:50 AM   ADL's:  Intact  Sleep:  Yes,  AEB:  Appetite:  Yes,  AEB:  Suicidal Ideation:   Plan:  No  Intent:  No  Means:  No  Homicidal Ideation:   Plan:  No  Intent:  No  Means:  No  AEB (as evidenced by):  Mental Status: General Appearance Adam Christian:  Casual Eye Contact:  Good Motor Behavior:  Normal Speech:  Normal Level of Consciousness:  Alert Mood:  Euthymic Affect:  Appropriate Anxiety Level:  None Thought Process:  Coherent, Relevant and Intact Thought Content:  WNL Perception:  Normal Judgment:  Good Insight:  Present Cognition:  Orientation time, place and person Memory Immediate Concentration Yes Sleep:  Number of Hours: 5.5   Vital Signs:Blood pressure 123/87, pulse 60, temperature 98 F (36.7 C), temperature source Oral, resp. rate 16, height 5\' 6"  (1.676 m), weight 71.668 kg (158 lb).  Lab Results: No results found for this or any previous visit (from the past 48 hour(s)).   Treatment Plan Summary: Daily contact with patient to assess and evaluate symptoms and progress in treatment Medication management  Plan: No medication changes and disposition plans in progress.   Adam Christian,JANARDHAHA R. 12/23/2010, 10:50 AM

## 2010-12-23 NOTE — Progress Notes (Signed)
  Shift Note: Pt has attended the program and interacts with select peers. Denies SI and HI. States that he is feeling better and family has also been up to visit him. Has been very approachable throughout the shift. When Pt does not get what he wants he can get upset and his anger can get out of control. Was able to laugh at himself however today while in group stating that he allowed his family to get the best of him yesterday.  Given support, reassurance and praise.

## 2010-12-23 NOTE — Progress Notes (Signed)
  Pt received his scheduled antibiotic and has been in a very happy upbeat mood. Pt denies pain and has been in the dayroom interacting appropriately. Pt. was informed of his hs scheduled medications and that he has no new changes in meds. Pt denies SI/HI/A/V hall. Safety maintained on unit, will continue to monitor.

## 2010-12-24 DIAGNOSIS — F329 Major depressive disorder, single episode, unspecified: Secondary | ICD-10-CM | POA: Diagnosis present

## 2010-12-24 MED ORDER — DOXYCYCLINE HYCLATE 100 MG PO TABS: 100.0000 mg | ORAL_TABLET | Freq: Two times a day (BID) | ORAL | Status: AC

## 2010-12-24 MED ORDER — CITALOPRAM HYDROBROMIDE 20 MG PO TABS: 20.0000 mg | ORAL_TABLET | Freq: Every day | ORAL | Status: DC

## 2010-12-24 NOTE — Progress Notes (Signed)
[facility] Group Notes:  (Counselor/Nursing/MHT/Case Management/Adjunct)  12/24/2010 1:04 PM  Type of Therapy:  Group Counseling  Participation Level:  Did Not Attend    Quincy Sheehan 12/24/2010, 1:04 PM  Cosigned by: Angus Palms, LCSW

## 2010-12-24 NOTE — Progress Notes (Signed)
[facility] Adult Inpatient Family/Significant Other Suicide Prevention Education  Suicide Prevention Education:  Education Completed; Adam Christian, sister,  (name of family member/significant other) has been identified by the patient as the family member/significant other with whom the patient will be residing, and identified as the person(s) who will aid the patient in the event of a mental health crisis (suicidal ideations/suicide attempt).  With written consent from the patient, the family member/significant other has been provided the following suicide prevention education, prior to the and/or following the discharge of the patient.  The suicide prevention education provided includes the following:  Suicide risk factors  Suicide prevention and interventions  National Suicide Hotline telephone number  [facility] assessment telephone number  [facility] Emergency Assistance 911  [facility] and/or Residential Mobile Crisis Unit telephone number  Request made of family/significant other to:  Remove weapons (e.g., guns, rifles, knives), all items previously/currently identified as safety concern.    Remove drugs/medications (over-the-counter, prescriptions, illicit drugs), all items previously/currently identified as a safety concern.  Adam Christian reported that she is supportive of Adam Christian and had been unaware that he sees her as his main support. She stated that now that she is aware, she will be more present in his life. Adam Christian stated that the home is secured and Adam Christian has no access to weapons. She verbalized understanding of suicide prevention information and stated that before being hospitalized she saw most of the warning signs in Adam Christian but did not know that they were warning signs. Adam Christian does not believe Adam Christian is a danger to himself or others at this time and had no further questions.   The family member/significant other verbalizes understanding of the suicide  prevention education information provided.  The family member/significant other agrees to remove the items of safety concern listed above.  Adam Christian 12/24/2010, 10:45 AM

## 2010-12-24 NOTE — Progress Notes (Signed)
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 Appetite: Yes, AEB: Good Appetite.   Mild>(1-10) >Severe  Hopelessness (1-10): 0  Depression (1-10): 0  Anxiety (1-10): 0   Suicidal Ideation:  Adamantly denies any suicidal ideations.  Plan: No  Intent: No  Means: No  Homicidal Ideation: Adamantly denies any Homicidal Ideations.  Plan: No  Intent: No  Means: No  Mental Status: AO x 3 General Appearance /Behavior: Neat and Casual  Eye Contact: Good  Motor Behavior: Normal  Speech: Normal  Level of Consciousness: Alert  Mood: Euthymic. Affect:  Bright and Full. Anxiety:  Resolved. Thought Process: Coherent  Thought Content: WNL with no auditory or visual hallucinations or delusional thinking.  Perception: Normal  Judgment: Good  Insight: Good.  Cognition: Orientation time, place and person   PLAN OF CARE:   Treatment Plan Summary:  1. Daily contact with patient to assess and evaluate symptoms and progress in treatment  2. Medication management  3. The patient will deny suicidal ideations for 48 hours prior to discharge and have a depression and anxiety rating of 3 or less.   Plan:  1. Continue current medications.  2. Discontinue today.   SUICIDE RISK:   Minimal: No identifiable suicidal ideation.  Patients presenting with no risk factors but with morbid ruminations; may be classified as minimal risk based on the severity of the depressive symptoms   Trannie Bardales 12/24/2010, 11:40 AM

## 2010-12-24 NOTE — Progress Notes (Signed)
Interdisciplinary Treatment Plan Update (Adult)  Date:  12/24/2010 Time Reviewed:  9:52 AM  Progress in Treatment: Attending groups: Yes. Participating in groups:  No. Taking medication as prescribed:  Yes. Tolerating medication:  Yes. Family/Significant othe contact made:  Yes, individual(s) contacted:  Contact made with Diallo's sister Patient understands diagnosis:  Yes. Discussing patient identified problems/goals with staff:  Yes.  Bladimir reports plan to stay on meds and look for work Medical problems stabilized or resolved:  Yes. Denies suicidal/homicidal ideation: Yes. Issues/concerns per patient self-inventory:  No. Other:  New problem(s) identified: No, Describe:  No new problems identified  Reason for Continuation of Hospitalization:   Interventions implemented related to continuation of hospitalization:  Additional comments:  Estimated length of stay:  D/C today  Discharge Plan:  Home with Mother  New goal(s):  Suicide prevention education reviewed  Mardell assisted with indigent meds  Bus pass provided    Review of initial/current patient goals per problem list:   1.  Goal(s):  Eliminate SI  Met:  Yes  Target date:d/c  As evidenced by:  [name] denies SI/HI  2.  Goal (s):  Stabilize on meds  Met:  Yes  Target date: d/c  As evidenced by:  [name] reports meds are working  3.  Goal(s):  Refer for outpatient f/u  Met:  Yes  Target date:d/c  As evidenced by:  Follow up with [facility] on 12/25/10 and [facility] on 11/14/2  4.  Goal(s):  Met:  No  Target date:  As evidenced by:  Attendees: Patient:  Adam Christian 11/12/20129:52 AM  Family:   11/12/20129:52 AM  Physician:  Franchot Gallo, MD 11/12/20129:52 AM  Nursing:   Nigel Bridgeman, NP 11/12/20129:52 AM  Case Manager:  Juline Patch, LCSW 11/12/20129:52 AM  Counselor:  Angus Palms, LCSW 11/12/20129:52 AM  Other:  Landry Corporal, NP 11/12/20129:52 AM  Other:   Quincy Sheehan, Counselor Intern 11/12/20129:52 AM  Other:   11/12/20129:52 AM  Other:   11/12/20129:52 AM   Scribe for Treatment Team:   Wynn Banker, 12/24/2010, 9:52 AM

## 2010-12-24 NOTE — Discharge Summary (Addendum)
Physician Discharge Summary  Patient ID: LAVERE STORK MRN: [id number] DOB/AGE: [birth year] 30 y.o.  Admit date: 12/20/2010 Discharge date: 12/24/2010  Admission Diagnoses: Major depression Disorder  Discharge Diagnoses: Major Depression Disorder Principal Problem:  *Depression, major   Discharged Condition: alert, oriented cooperative. Depressive symptoms resolved, adamantly denies any suicidal or homicidal thinking. No side effects to medications.  Hospital Course: admitted to the adult milieu. Started on  celexa for depressive symptoms and doxycycline for absceses. Patient was doing well, depressive symptoms resolving. Sleep and appetite were doing good.  On day of discharge, patient was fully alert and cooperative. Appreciative of the help. We made appointments for [facility] for follow up of his skin abscesses.      Discharge Exam: Blood pressure 124/87, pulse 62, temperature 97 F (36.1 C), temperature source Oral, resp. rate 20, height 5\' 6"  (1.676 m), weight 71.668 kg (158 lb).   Disposition: Home or Self Care  Discharge Orders    Future Orders Please Complete By Expires   Diet - low sodium heart healthy        Discharge Medication List as of 12/24/2010  1:13 PM    START taking these medications   Details  citalopram (CELEXA) 20 MG tablet Take 1 tablet (20 mg total) by mouth at bedtime., Starting 12/24/2010, Until Tue 12/24/11, Print    doxycycline (VIBRA-TABS) 100 MG tablet Take 1 tablet (100 mg total) by mouth every 12 (twelve) hours., Starting 12/24/2010, Until Thu 01/03/11, No Print       Follow-up Information    Follow up with [facility] on 12/25/2010. (Please go to the walk in clinic at [facility] between 8-11 a.m.)    Contact information:   [address] [place], [place]  [address] [phone]      Follow up with [facility] on 12/26/2010. (Your appointment is with Harriet Masson at 9:00)    Contact information:   [address] [place], [place]  [address] [phone]         Signed: Mechele Dawley 12/24/2010, 2:11 PM

## 2010-12-24 NOTE — Progress Notes (Signed)
Pt's discharge instructions reviewed with pt and hard copy given. Pt denies any questions or needs and verb understanding regarding rx and f/u appts. Also denies SI/HI, A/VH. Belongings returned to pt, who states the belongings are all present, samples of meds and one written rx given along with printed info about each medication.  Pt denies further needs and departs facility in safe condition with his mother.

## 2010-12-24 NOTE — Progress Notes (Signed)
Patient seen during d/c planning group and treatment team.  He denies SI/HI, depression and anxiety.  He is looking forward to discharging to his mother's home today.  Patient reports plans to stay on meds and to follow up with medication management.  He also reports plans to get back in school and get his own place.  Patient assisted with indigent medications and a  Bus pass.

## 2010-12-25 NOTE — Progress Notes (Signed)
Patient Discharge Instructions:  No signed consents for [facility], or [facility]  Wandra Scot, 12/25/2010, 12:56 PM

## 2011-05-05 ENCOUNTER — Emergency Department (HOSPITAL_BASED_OUTPATIENT_CLINIC_OR_DEPARTMENT_OTHER)
Admission: EM | Admit: 2011-05-05 | Discharge: 2011-05-05 | Disposition: A | Discharge status: Home / Self Care | Payer: Self-pay | Attending: Emergency Medicine | Admitting: Emergency Medicine

## 2011-05-05 ENCOUNTER — Encounter (HOSPITAL_BASED_OUTPATIENT_CLINIC_OR_DEPARTMENT_OTHER): Payer: Self-pay | Admitting: *Deleted

## 2011-05-05 DIAGNOSIS — L732 Hidradenitis suppurativa: Secondary | ICD-10-CM | POA: Insufficient documentation

## 2011-05-05 DIAGNOSIS — L089 Local infection of the skin and subcutaneous tissue, unspecified: Secondary | ICD-10-CM | POA: Insufficient documentation

## 2011-05-05 MED ORDER — SULFAMETHOXAZOLE-TRIMETHOPRIM 800-160 MG PO TABS: 1.0000 | ORAL_TABLET | Freq: Two times a day (BID) | ORAL | Status: AC

## 2011-05-05 MED ORDER — SULFAMETHOXAZOLE-TMP DS 800-160 MG PO TABS
1.0000 | ORAL_TABLET | Freq: Once | ORAL | Status: AC
  Administered 2011-05-05: 1 via ORAL
  Filled 2011-05-05: qty 1

## 2011-05-05 NOTE — ED Notes (Signed)
Pt states he has a hx of recurrent skin infections, but has tested negative for MRSA. Presents now with 2 large areas on his thighs.

## 2011-05-05 NOTE — Discharge Instructions (Signed)
Hidradenitis Suppurativa, Sweat Gland Abscess Hidradenitis suppurativa is a long lasting (chronic), uncommon disease of the sweat glands. With this, boil-like lumps and scarring develop in the groin, some times under the arms (axillae), and under the breasts. It may also uncommonly occur behind the ears, in the crease of the buttocks, and around the genitals.  CAUSES  The cause is from a blocking of the sweat glands. They then become infected. It may cause drainage and odor. It is not contagious. So it cannot be given to someone else. It most often shows up in puberty (about 58 to 29 years of age). But it may happen much later. It is similar to acne which is a disease of the sweat glands. This condition is slightly more common in African-Americans and women. SYMPTOMS   Hidradenitis usually starts as one or more red, tender, swellings in the groin or under the arms (axilla).   Over a period of hours to days the lesions get larger. They often open to the skin surface, draining clear to yellow-colored fluid.   The infected area heals with scarring.  DIAGNOSIS  Your caregiver makes this diagnosis by looking at you. Sometimes cultures (growing germs on plates in the lab) may be taken. This is to see what germ (bacterium) is causing the infection.  TREATMENT   Topical germ killing medicine applied to the skin (antibiotics) are the treatment of choice. Antibiotics taken by mouth (systemic) are sometimes needed when the condition is getting worse or is severe.   Avoid tight-fitting clothing which traps moisture in.   Dirt does not cause hidradenitis and it is not caused by poor hygiene.   Involved areas should be cleaned daily using an antibacterial soap. Some patients find that the liquid form of Lever 2000, applied to the involved areas as a lotion after bathing, can help reduce the odor related to this condition.   Sometimes surgery is needed to drain infected areas or remove scarred tissue.  Removal of large amounts of tissue is used only in severe cases.   Birth control pills may be helpful.   Oral retinoids (vitamin A derivatives) for 6 to 12 months which are effective for acne may also help this condition.   Weight loss will improve but not cure hidradenitis. It is made worse by being overweight. But the condition is not caused by being overweight.   This condition is more common in people who have had acne.   It may become worse under stress.  There is no medical cure for hidradenitis. It can be controlled, but not cured. The condition usually continues for years with periods of getting worse and getting better (remission). Document Released: 09/12/2003 Document Revised: 01/17/2011 Document Reviewed: 09/28/2007 [facility] Patient Information 2012 [place], [place].  Use resource guide below to find a primary care provider. Take Septra as directed if not improving in the 4-5 days return return also for any new or worse symptoms. When possible would be best to have referral to Gen. surgery to have definitive treatment of this.  RESOURCE GUIDE  Dental Problems  Patients with Medicaid: [facility] [phone] [address]  [address] Phone:  [phone]                                                  Phone:  [phone]  If unable to pay or uninsured, contact:  [facility] or [facility]. to become qualified for the adult dental clinic.    Wound care for the draining abscesses as described. Recommend soaking the groin area in warm bathtub water for 20 minutes at least once a day.  Chronic Pain Problems Contact [facility]  [phone] Patients need to be referred by their primary care doctor.  Insufficient Money for Medicine Contact [employer]:  call "211" or [facility] [phone].  No Primary Care Doctor Call [facility]   [phone] Other agencies that provide inexpensive medical care    [facility]  [phone]    [facility]  [phone]    [facility]  [phone]    [facility]  [phone]    [facility]  [phone]    [facility]  [phone]  Psychological Services [facility] Behavioral Health  [phone] [facility]  [phone] [facility] Mental [facility]   [phone] (emergency services [phone])  Substance Abuse Resources [facility] Drug Services  [phone] [facility] [phone])[phone] [facility] [phone] Floydene Flock [phone] Residential & Outpatient Substance Abuse Program  517-505-1301  Abuse/Neglect [facility] Child Abuse Hotline 330-189-0568 [facility] Child Abuse Hotline 406-540-6955 (After Hours)  Emergency Shelter [facility] [phone]  Maternity [facility] [place] [facility] (445)366-5259 [facility] [phone]  MRSA Hotline #:   548-681-4663    [facility] Resources  [facility] [place] [facility]     [employer]                          [facility] Dept. [address] [place]                       [address]      [address] [place] [address]  Blondell Reveal Phone:  [phone]                                   Phone:  [phone]                 Phone:  [phone]  [facility] Mental [facility] Phone:  [phone]  [facility] Child Abuse Hotline (931)886-9272 (302)[phone] (After Hours)

## 2011-05-05 NOTE — ED Provider Notes (Signed)
History   This chart was scribed for Shelda Jakes, MD by Charolett Bumpers . The patient was seen in room MH11/MH11 and the patient's care was started at 4:22pm.   CSN: [id number]  Arrival date & time 05/05/11  1452   First MD Initiated Contact with Patient 05/05/11 1617      Chief Complaint  Patient presents with  . Recurrent Skin Infections    (Consider location/radiation/quality/duration/timing/severity/associated sxs/prior treatment) HPI Adam Christian is a 31 y.o. male who presents to the Emergency Department complaining of chronic, moderate skin infections that have been present for the past several weeks. Patient states that the abscesses on his thighs are new, and noted some drainage that started today in ED. Patient notes abscesses on left and right armpits and chest area that have been there for the past several months. Patient notes that on his left armpit, he has had the abscess lanced with prior treatments. Patient states that he has been treated with Doxycycline and Septra in the past. Patient states that he has tested negative for MRSA. Patient states that he has sought out treatment at [facility] in the past, but is not taking a long-term approach at this time.    Past Medical History  Diagnosis Date  . Depression   . Cyst 31 years old was first occurance    Pt has multiple cysts: L axila, groing, gluteous, scrotum    Past Surgical History  Procedure Date  . Kidney donation   . Tonsillectomy     Family History  Problem Relation Age of Onset  . Kidney failure Mother     History  Substance Use Topics  . Smoking status: Current Everyday Smoker  . Smokeless tobacco: Not on file  . Alcohol Use: No      Review of Systems A complete 10 system review of systems was obtained and is otherwise negative except as noted in the HPI and PMH.   Allergies  Iodine and Shrimp  Home Medications   Current Outpatient Rx  Name Route Sig Dispense  Refill  . CHLORHEXIDINE GLUCONATE 4 % EX LIQD Topical Apply 1 application topically daily as needed.    Marland Kitchen CITALOPRAM HYDROBROMIDE 20 MG PO TABS Oral Take 1 tablet (20 mg total) by mouth at bedtime. 30 tablet 0  . DOXYCYCLINE HYCLATE 100 MG PO CPEP Oral Take 100 mg by mouth 2 (two) times daily. Last dose 05/05/11    . SULFAMETHOXAZOLE-TRIMETHOPRIM 800-160 MG PO TABS Oral Take 1 tablet by mouth every 12 (twelve) hours. 28 tablet 0    BP 114/68  Pulse 76  Temp(Src) 98.3 F (36.8 C) (Oral)  Resp 18  Ht 5\' 5"  (1.651 m)  Wt 177 lb (80.287 kg)  BMI 29.45 kg/m2  SpO2 98%  Physical Exam  Nursing note and vitals reviewed. Constitutional: He is oriented to person, place, and time. He appears well-developed and well-nourished. No distress.  HENT:  Head: Normocephalic and atraumatic.  Right Ear: External ear normal.  Left Ear: External ear normal.  Nose: Nose normal.  Mouth/Throat: Oropharynx is clear and moist.  Eyes: EOM are normal. Pupils are equal, round, and reactive to light.  Neck: Normal range of motion. Neck supple. No tracheal deviation present.  Cardiovascular: Normal rate, regular rhythm and normal heart sounds.  Exam reveals no gallop and no friction rub.   No murmur heard. Pulmonary/Chest: Effort normal and breath sounds normal. No respiratory distress. He has no wheezes. He has no rales.  Abdominal: Soft. Bowel sounds are normal. He exhibits no distension. There is no tenderness.  Genitourinary:       Groin adenopathy bilaterally.   Musculoskeletal: Normal range of motion. He exhibits no edema.  Neurological: He is alert and oriented to person, place, and time. No cranial nerve deficit or sensory deficit. Coordination normal.  Skin: Skin is warm and dry.       Chronic scarring in left armpit and induration from old abscess. Long surgical incision in left armpit from prior treatment. Abscess noted in right armpit. In the groin area, two 3 cm abscess with the left one draining. On  the left chest, 1 cm skin lesion.   Psychiatric: He has a normal mood and affect. His behavior is normal.    ED Course  Procedures (including critical care time)  DIAGNOSTIC STUDIES: Oxygen Saturation is 98% on room air, normal by my interpretation.    COORDINATION OF CARE:  1628: Discussed planned course of treatment with the patient who is agreeable at this time. Discussed prescribing antibiotic, and finding a PCP to treat this recurrent problem long-term.  1645: Medication Orders: Sulfamethoxazole-trimethoprim 800-160 mg per tablet 1 tablet once.  Labs Reviewed - No data to display No results found.   1. Hydradenitis       MDM  Patient with multiple skin abscesses predominantly in the axillary area in the groin area and most likely consistent with hydro-adenitis. Left inner thigh groin area with draining abscess I&D not required. Patient has had surgical excision of his right axillary area and has less problems there. Left axillary area has several chronic cysts and abscesses and scarring. Does not have the ability to follow up with general surgery at this time. We'll treat with antibiotics and warm soaks and wound care.  I personally performed the services described in this documentation, which was scribed in my presence. The recorded information has been reviewed and considered.         Shelda Jakes, MD 05/05/11 [phone]

## 2011-05-20 ENCOUNTER — Emergency Department (HOSPITAL_BASED_OUTPATIENT_CLINIC_OR_DEPARTMENT_OTHER)
Admission: EM | Admit: 2011-05-20 | Discharge: 2011-05-20 | Disposition: A | Discharge status: Home / Self Care | Payer: Self-pay | Attending: Emergency Medicine | Admitting: Emergency Medicine

## 2011-05-20 ENCOUNTER — Encounter (HOSPITAL_BASED_OUTPATIENT_CLINIC_OR_DEPARTMENT_OTHER): Payer: Self-pay

## 2011-05-20 DIAGNOSIS — L02419 Cutaneous abscess of limb, unspecified: Secondary | ICD-10-CM | POA: Insufficient documentation

## 2011-05-20 DIAGNOSIS — F17200 Nicotine dependence, unspecified, uncomplicated: Secondary | ICD-10-CM | POA: Insufficient documentation

## 2011-05-20 DIAGNOSIS — L03119 Cellulitis of unspecified part of limb: Secondary | ICD-10-CM | POA: Insufficient documentation

## 2011-05-20 DIAGNOSIS — L732 Hidradenitis suppurativa: Secondary | ICD-10-CM

## 2011-05-20 DIAGNOSIS — IMO0002 Reserved for concepts with insufficient information to code with codable children: Secondary | ICD-10-CM | POA: Insufficient documentation

## 2011-05-20 MED ORDER — HYDROCODONE-ACETAMINOPHEN 5-325 MG PO TABS: 1.0000 | ORAL_TABLET | Freq: Four times a day (QID) | ORAL | Status: AC | PRN

## 2011-05-20 MED ORDER — LIDOCAINE-EPINEPHRINE 2 %-1:100000 IJ SOLN
20.0000 mL | Freq: Once | INTRAMUSCULAR | Status: AC
  Administered 2011-05-20: 20 mL
  Filled 2011-05-20: qty 1

## 2011-05-20 NOTE — ED Notes (Signed)
Patient states that he has this "boil" for a month. Patient has been on Bactrim for last 2 weeks, which he has is still taking the bactrim. Abscess leaks through small hole, drainage is yellow with some blood streak in it at times. Patient states that it is very painful. Patient has pressed on it to get some of the drainage out.

## 2011-05-20 NOTE — ED Provider Notes (Signed)
History     CSN: [id number]  Arrival date & time 05/20/11  1455   First MD Initiated Contact with Patient 05/20/11 1515      3:40 PM HPI Adam Christian is 31 y.o. male complaining of an abscess to the right superior medial thigh. States abscess has been recurrent for several weeks. Reports small hole that has been draining. States abscess well "drain completely and then fill back up". Reports a significant history of multiple abscess his throughout his life. Has them under both arms and in the groin. Reports that the one on his right thigh is beginning to hurt denies erythema. Patient is a 31 y.o. male presenting with abscess. The history is provided by the patient.  Abscess  This is a recurrent problem. The current episode started more than one week ago. The onset was gradual. The problem occurs continuously. The problem has been gradually worsening. The abscess is present on the right upper leg. The problem is moderate. The abscess is characterized by draining, painfulness and swelling. Pertinent negatives include no fever and no vomiting.    Past Medical History  Diagnosis Date  . Depression   . Cyst 31 years old was first occurance    Pt has multiple cysts: L axila, groing, gluteous, scrotum    Past Surgical History  Procedure Date  . Kidney donation   . Tonsillectomy   . Tonsillectomy   . Boil   . Kidney surgery March 29, 2003    patient gave mother one of his kidneys    Family History  Problem Relation Age of Onset  . Kidney failure Mother     History  Substance Use Topics  . Smoking status: Current Everyday Smoker  . Smokeless tobacco: Not on file  . Alcohol Use: Yes     occasionally      Review of Systems  Constitutional: Negative for fever and chills.  Gastrointestinal: Negative for nausea and vomiting.  Skin: Positive for wound.       Abscesses  All other systems reviewed and are negative.    Allergies  Iodine and Shrimp  Home Medications    Current Outpatient Rx  Name Route Sig Dispense Refill  . CHLORHEXIDINE GLUCONATE 4 % EX LIQD Topical Apply 1 application topically daily as needed.    Marland Kitchen CITALOPRAM HYDROBROMIDE 20 MG PO TABS Oral Take 1 tablet (20 mg total) by mouth at bedtime. 30 tablet 0  . DOXYCYCLINE HYCLATE 100 MG PO CPEP Oral Take 100 mg by mouth 2 (two) times daily. Last dose 05/05/11      BP 136/60  Pulse 78  Temp(Src) 97.6 F (36.4 C) (Oral)  Resp 20  Ht 5\' 4"  (1.626 m)  Wt 160 lb (72.576 kg)  BMI 27.46 kg/m2  SpO2 100%  Physical Exam  Constitutional: He is oriented to person, place, and time. He appears well-developed and well-nourished.  HENT:  Head: Normocephalic and atraumatic.  Eyes: Pupils are equal, round, and reactive to light.  Neurological: He is alert and oriented to person, place, and time.  Skin: Skin is warm and dry. No rash noted. No erythema. No pallor.       Patient has a larger 3cm X5 cm laceration that is draining in his right superior medial thigh. TTP. Fluctuant, nonerythematous.   Also has multiple smaller abscesses in his bilateral axilla and one in his left groin. Theses are all draining and nontender  Psychiatric: He has a normal mood and affect. His behavior is normal.  ED Course  Procedures  INCISION AND DRAINAGE Performed by: Thomasene Lot Consent: Verbal consent obtained. Risks and benefits: risks, benefits and alternatives were discussed Type: abscess  Body area: Right thigh  Anesthesia: local infiltration  Local anesthetic: lidocaine 2% with epinephrine  Anesthetic total: 6 ml  Complexity: complex Blunt dissection to break up loculations  Drainage: purulent  Drainage amount: large amount of yellowish/clear fluid  Packing material: 1/4 in iodoform gauze  Patient tolerance: Patient tolerated the procedure well with no immediate complications.   MDM  Patient is already on bactrim and doxycycline. Will give vicodin for pain if needed. Referred to  Surgery due to severe hydradenitis suppurativa. Pt agrees with plan and is ready to d/c        Thomasene Lot, PA-C 05/20/11 1726

## 2011-05-20 NOTE — Discharge Instructions (Signed)
Hidradenitis Suppurativa, Sweat Gland Abscess Hidradenitis suppurativa is a long lasting (chronic), uncommon disease of the sweat glands. With this, boil-like lumps and scarring develop in the groin, some times under the arms (axillae), and under the breasts. It may also uncommonly occur behind the ears, in the crease of the buttocks, and around the genitals.  CAUSES  The cause is from a blocking of the sweat glands. They then become infected. It may cause drainage and odor. It is not contagious. So it cannot be given to someone else. It most often shows up in puberty (about 10 to 29 years of age). But it may happen much later. It is similar to acne which is a disease of the sweat glands. This condition is slightly more common in African-Americans and women. SYMPTOMS   Hidradenitis usually starts as one or more red, tender, swellings in the groin or under the arms (axilla).   Over a period of hours to days the lesions get larger. They often open to the skin surface, draining clear to yellow-colored fluid.   The infected area heals with scarring.  DIAGNOSIS  Your caregiver makes this diagnosis by looking at you. Sometimes cultures (growing germs on plates in the lab) may be taken. This is to see what germ (bacterium) is causing the infection.  TREATMENT   Topical germ killing medicine applied to the skin (antibiotics) are the treatment of choice. Antibiotics taken by mouth (systemic) are sometimes needed when the condition is getting worse or is severe.   Avoid tight-fitting clothing which traps moisture in.   Dirt does not cause hidradenitis and it is not caused by poor hygiene.   Involved areas should be cleaned daily using an antibacterial soap. Some patients find that the liquid form of Lever 2000, applied to the involved areas as a lotion after bathing, can help reduce the odor related to this condition.   Sometimes surgery is needed to drain infected areas or remove scarred tissue.  Removal of large amounts of tissue is used only in severe cases.   Birth control pills may be helpful.   Oral retinoids (vitamin A derivatives) for 6 to 12 months which are effective for acne may also help this condition.   Weight loss will improve but not cure hidradenitis. It is made worse by being overweight. But the condition is not caused by being overweight.   This condition is more common in people who have had acne.   It may become worse under stress.  There is no medical cure for hidradenitis. It can be controlled, but not cured. The condition usually continues for years with periods of getting worse and getting better (remission). Document Released: 09/12/2003 Document Revised: 01/17/2011 Document Reviewed: 09/28/2007 [employer] Patient Information 2012 [employer]. 

## 2011-06-08 NOTE — ED Provider Notes (Signed)
Evaluation and management procedures were performed by the PA/NP/resident physician under my supervision/collaboration.   Namari Breton D Kimiko Common, MD 06/08/11 2009 

## 2011-11-25 ENCOUNTER — Emergency Department (HOSPITAL_COMMUNITY)
Admission: EM | Admit: 2011-11-25 | Discharge: 2011-11-25 | Disposition: A | Discharge status: Home / Self Care | Payer: Self-pay | Attending: Emergency Medicine | Admitting: Emergency Medicine

## 2011-11-25 ENCOUNTER — Encounter (HOSPITAL_COMMUNITY): Payer: Self-pay

## 2011-11-25 DIAGNOSIS — T7840XA Allergy, unspecified, initial encounter: Secondary | ICD-10-CM

## 2011-11-25 DIAGNOSIS — L272 Dermatitis due to ingested food: Secondary | ICD-10-CM | POA: Insufficient documentation

## 2011-11-25 DIAGNOSIS — R220 Localized swelling, mass and lump, head: Secondary | ICD-10-CM | POA: Insufficient documentation

## 2011-11-25 DIAGNOSIS — R1110 Vomiting, unspecified: Secondary | ICD-10-CM | POA: Insufficient documentation

## 2011-11-25 DIAGNOSIS — Z79899 Other long term (current) drug therapy: Secondary | ICD-10-CM | POA: Insufficient documentation

## 2011-11-25 MED ORDER — EPINEPHRINE 0.3 MG/0.3ML IJ DEVI: 0.3000 mg | INTRAMUSCULAR | Status: DC | PRN

## 2011-11-25 MED ORDER — ALBUTEROL SULFATE (5 MG/ML) 0.5% IN NEBU
5.0000 mg | INHALATION_SOLUTION | Freq: Once | RESPIRATORY_TRACT | Status: AC
  Administered 2011-11-25: 5 mg via RESPIRATORY_TRACT
  Filled 2011-11-25: qty 1

## 2011-11-25 MED ORDER — DIPHENHYDRAMINE HCL 50 MG/ML IJ SOLN
50.0000 mg | Freq: Once | INTRAMUSCULAR | Status: AC
  Administered 2011-11-25: 50 mg via INTRAVENOUS
  Filled 2011-11-25: qty 1

## 2011-11-25 MED ORDER — PREDNISONE 10 MG PO TABS: 20.0000 mg | ORAL_TABLET | Freq: Every day | ORAL | Status: DC

## 2011-11-25 MED ORDER — METHYLPREDNISOLONE SODIUM SUCC 125 MG IJ SOLR
125.0000 mg | Freq: Once | INTRAMUSCULAR | Status: AC
  Administered 2011-11-25: 125 mg via INTRAVENOUS
  Filled 2011-11-25: qty 2

## 2011-11-25 MED ORDER — FAMOTIDINE IN NACL 20-0.9 MG/50ML-% IV SOLN
20.0000 mg | Freq: Once | INTRAVENOUS | Status: AC
  Administered 2011-11-25: 20 mg via INTRAVENOUS
  Filled 2011-11-25: qty 50

## 2011-11-25 MED ORDER — DIPHENHYDRAMINE HCL 25 MG PO TABS: 25.0000 mg | ORAL_TABLET | Freq: Four times a day (QID) | ORAL | Status: DC | PRN

## 2011-11-25 NOTE — ED Provider Notes (Addendum)
History     CSN: [id number]  Arrival date & time 11/25/11  1429   First MD Initiated Contact with Patient 11/25/11 1505      Chief Complaint  Patient presents with  . Allergic Reaction    (Consider location/radiation/quality/duration/timing/severity/associated sxs/prior treatment) Patient is a 31 y.o. male presenting with allergic reaction.  Allergic Reaction The primary symptoms are  wheezing, vomiting and dizziness. The primary symptoms do not include shortness of breath, abdominal pain, nausea or rash.  Dizziness also occurs with vomiting. Dizziness does not occur with nausea or weakness.    Pt states he ate shrimp 30-45 min prior to arrival and states he is having dizziness, vomiting x 1 and faint sense of throat swelling. No respiratory distress. No facial or tongue swelling. No rash. Pt has had similar reaction to shrimp in the past Past Medical History  Diagnosis Date  . Depression   . Cyst 31 years old was first occurance    Pt has multiple cysts: L axila, groing, gluteous, scrotum    Past Surgical History  Procedure Date  . Kidney donation   . Tonsillectomy   . Tonsillectomy   . Boil   . Kidney surgery March 29, 2003    patient gave mother one of his kidneys    Family History  Problem Relation Age of Onset  . Kidney failure Mother     History  Substance Use Topics  . Smoking status: Current Every Day Smoker  . Smokeless tobacco: Never Used  . Alcohol Use: Yes     occasionally      Review of Systems  Constitutional: Negative for fever and chills.  HENT: Negative for facial swelling, trouble swallowing and voice change.   Eyes: Negative for visual disturbance.  Respiratory: Positive for wheezing. Negative for chest tightness, shortness of breath and stridor.   Cardiovascular: Negative for chest pain.  Gastrointestinal: Positive for vomiting. Negative for nausea and abdominal pain.  Musculoskeletal: Negative for back pain.  Skin: Negative for  rash and wound.  Neurological: Positive for dizziness. Negative for syncope, weakness, numbness and headaches.    Allergies  Iodine and Shrimp  Home Medications   Current Outpatient Rx  Name Route Sig Dispense Refill  . CITALOPRAM HYDROBROMIDE 20 MG PO TABS Oral Take 1 tablet (20 mg total) by mouth at bedtime. 30 tablet 0  . CHLORHEXIDINE GLUCONATE 4 % EX LIQD Topical Apply 1 application topically daily as needed.    Marland Kitchen DIPHENHYDRAMINE HCL 25 MG PO TABS Oral Take 1 tablet (25 mg total) by mouth every 6 (six) hours as needed (allergic reaction). 20 tablet 0  . DOXYCYCLINE HYCLATE 100 MG PO CPEP Oral Take 100 mg by mouth 2 (two) times daily. Last dose 05/05/11    . EPINEPHRINE 0.3 MG/0.3ML IJ DEVI Intramuscular Inject 0.3 mLs (0.3 mg total) into the muscle as needed. 1 Device 0  . PREDNISONE 10 MG PO TABS Oral Take 2 tablets (20 mg total) by mouth daily. 10 tablet 0  . SULFAMETHOXAZOLE-TMP DS 800-160 MG PO TABS Oral Take 1 tablet by mouth 2 (two) times daily. Patient is using this medication for pain.      BP 125/67  Pulse 75  Temp 98.2 F (36.8 C) (Oral)  Resp 18  SpO2 97%  Physical Exam  Nursing note and vitals reviewed. Constitutional: He is oriented to person, place, and time. He appears well-developed and well-nourished. No distress.  HENT:  Head: Normocephalic and atraumatic.  Mouth/Throat: Oropharynx is clear and  moist.       OP clear without any evidence of swelling  Eyes: EOM are normal. Pupils are equal, round, and reactive to light.  Neck: Normal range of motion. Neck supple.  Cardiovascular: Normal rate and regular rhythm.   Pulmonary/Chest: Effort normal. No respiratory distress. He has wheezes (mild wheezing throughout. no Stridor). He has no rales.  Abdominal: Soft. Bowel sounds are normal. There is no tenderness. There is no rebound and no guarding.  Musculoskeletal: Normal range of motion. He exhibits no edema and no tenderness.  Neurological: He is alert and  oriented to person, place, and time.  Skin: Skin is warm and dry. No rash noted. No erythema. No pallor.    ED Course  Procedures (including critical care time)  Labs Reviewed - No data to display No results found.   1. Allergic reaction       MDM  Will treat and observe in ED.   Pt states he is feeling better. Asking to eat. Lungs sound clear.   Pt observed 3.5 hours in ED. Remains symptom-free. D/c home with epi-pen and steroids. Urged to return for worsening symptoms  Loren Racer, MD 11/25/11 1754  Loren Racer, MD 11/25/11 1754

## 2011-11-25 NOTE — ED Notes (Signed)
Patient is allergic to shrimp. Patient states he was at [employer] and stated he accidentally ate food with shrimp in it. Vomited x 1. Patient states that he feels tight in his throat. Pt. C/o slight difficulty breathing and swallowing.

## 2011-11-25 NOTE — Progress Notes (Signed)
Pt confirms no pcp 

## 2011-11-25 NOTE — ED Notes (Signed)
Pt reports generally feeling better.

## 2011-11-25 NOTE — ED Notes (Signed)
[id number]<AV> Expected date:<BR> Expected time:<BR> Means of arrival:<BR> Comments:<BR> Hold for Triage 3

## 2012-06-29 ENCOUNTER — Emergency Department (HOSPITAL_COMMUNITY)
Admission: EM | Admit: 2012-06-29 | Discharge: 2012-06-29 | Disposition: A | Discharge status: Home / Self Care | Payer: Self-pay | Attending: Emergency Medicine | Admitting: Emergency Medicine

## 2012-06-29 ENCOUNTER — Encounter (HOSPITAL_COMMUNITY): Payer: Self-pay | Admitting: Emergency Medicine

## 2012-06-29 DIAGNOSIS — Z872 Personal history of diseases of the skin and subcutaneous tissue: Secondary | ICD-10-CM | POA: Insufficient documentation

## 2012-06-29 DIAGNOSIS — L02219 Cutaneous abscess of trunk, unspecified: Secondary | ICD-10-CM | POA: Insufficient documentation

## 2012-06-29 DIAGNOSIS — F17200 Nicotine dependence, unspecified, uncomplicated: Secondary | ICD-10-CM | POA: Insufficient documentation

## 2012-06-29 DIAGNOSIS — L02214 Cutaneous abscess of groin: Secondary | ICD-10-CM

## 2012-06-29 DIAGNOSIS — Z8659 Personal history of other mental and behavioral disorders: Secondary | ICD-10-CM | POA: Insufficient documentation

## 2012-06-29 DIAGNOSIS — L732 Hidradenitis suppurativa: Secondary | ICD-10-CM | POA: Insufficient documentation

## 2012-06-29 MED ORDER — HYDROCODONE-ACETAMINOPHEN 5-325 MG PO TABS: 1.0000 | ORAL_TABLET | Freq: Four times a day (QID) | ORAL | Status: DC | PRN

## 2012-06-29 MED ORDER — HYDROMORPHONE HCL PF 1 MG/ML IJ SOLN
1.0000 mg | Freq: Once | INTRAMUSCULAR | Status: AC
  Administered 2012-06-29: 1 mg via INTRAMUSCULAR
  Filled 2012-06-29: qty 1

## 2012-06-29 MED ORDER — LIDOCAINE HCL 2 % IJ SOLN
INTRAMUSCULAR | Status: AC
  Filled 2012-06-29: qty 20

## 2012-06-29 MED ORDER — SULFAMETHOXAZOLE-TRIMETHOPRIM 800-160 MG PO TABS: 2.0000 | ORAL_TABLET | Freq: Two times a day (BID) | ORAL | Status: DC

## 2012-06-29 NOTE — ED Provider Notes (Signed)
Medical screening examination/treatment/procedure(s) were conducted as a shared visit with non-physician practitioner(s) and myself.  I personally evaluated the patient during the encounter.  Multiple spontaneous superficial abscess draining left axilla left groin scrotal region with right proximal medial thigh containing a single larger fluctuant area several centimeters in diameter consistent with a larger abscess not spontaneously draining like his other smaller abscesses are; patient aware of patient referral and follow up with general surgery appears reasonable; since he has not been on antibiotics for several months and now has multiple spontaneously draining abscesses from his hidradenitis suppurativa he'll be placed on antibiotics again   Hurman Horn, MD 07/06/12 [phone]

## 2012-06-29 NOTE — ED Notes (Signed)
6 month hx of intermittent abscesses in groin and rectal area. Pt is concerned about unresolved area in groin, site of previous I and D Also reports abscess under l/arm

## 2012-06-29 NOTE — ED Notes (Signed)
MD at bedside. 

## 2012-06-29 NOTE — Progress Notes (Signed)
[facility] ED CM consulted by ED PA, Ebbie Ridge about pt need for a general surgeon f/u appointment Recommend he consult with the on call general surgeon (amion) to have pt seen CM noted this pt has already been seen by [facility] liaison for self pay/uninsured pcp resources  Cm spoke with pt at 1805 and discussed Cm consult with ED PA. Pt mentioned a program he heard about through DSS that would allow him to have a surgical procedure on an emergency basis.  CM discussed with the pt that she was not aware of a program and referred him to DSS.  Pt reports having a hx of multiple cysts in his arms with previously completed surgery.  Pt reports coming to ED from his vacation with this noted pain. Pt inquired if he would receive pain medication along with the antibiotics Cm referred him to ED PA.  CM reviewed with pt that most specialist offices will see him and may request he speak with the billing office for payment plans CM discussed PA consulting with on call surgeon to have pt seen and Cm encouraged pt to make an appointment with the on call surgeon Pt voiced understanding ED PA updated on CM interventions CM signing off.

## 2012-06-29 NOTE — ED Notes (Signed)
PA will reassess for multiple sites

## 2012-06-29 NOTE — ED Provider Notes (Signed)
History     CSN: [id number]  Arrival date & time 06/29/12  1405   First MD Initiated Contact with Patient 06/29/12 1612      Chief Complaint  Patient presents with  . Abscess    multiple abscess under arms and in groin area    (Consider location/radiation/quality/duration/timing/severity/associated sxs/prior treatment) HPI Patient presents emergency department with multiple abscesses to increase her last 2 days.  Patient, states she's had this problem in the past.  Patient has areas that are draining in his left armpit bilaterally and bilateral scrotum and bilateral buttocks.  Patient, states he has not had any fevers, nausea, vomiting, diarrhea, headache, blurred vision, chest pain, or shortness of breath.  Patient, states he didn't take anything prior to arrival for his symptoms.  Patient denies any immune compromise Past Medical History  Diagnosis Date  . Depression   . Cyst 32 years old was first occurance    Pt has multiple cysts: L axila, groing, gluteous, scrotum    Past Surgical History  Procedure Laterality Date  . Kidney donation    . Tonsillectomy    . Tonsillectomy    . Boil    . Kidney surgery  March 29, 2003    patient gave mother one of his kidneys    Family History  Problem Relation Age of Onset  . Kidney failure Mother     History  Substance Use Topics  . Smoking status: Current Every Day Smoker  . Smokeless tobacco: Never Used  . Alcohol Use: Yes     Comment: occasionally      Review of Systems All other systems negative except as documented in the HPI. All pertinent positives and negatives as reviewed in the HPI. Allergies  Iodine and Shrimp  Home Medications   Current Outpatient Rx  Name  Route  Sig  Dispense  Refill  . ibuprofen (ADVIL,MOTRIN) 200 MG tablet   Oral   Take 200 mg by mouth every 6 (six) hours as needed for pain.           BP 107/63  Pulse 90  Temp(Src) 98.3 F (36.8 C) (Oral)  Resp 16  Wt 175 lb (79.379 kg)   BMI 30.02 kg/m2  SpO2 100%  Physical Exam  Nursing note and vitals reviewed. Constitutional: He is oriented to person, place, and time. He appears well-developed and well-nourished. No distress.  HENT:  Head: Normocephalic and atraumatic.  Mouth/Throat: Oropharynx is clear and moist.  Eyes: Pupils are equal, round, and reactive to light.  Neck: Normal range of motion. Neck supple.  Cardiovascular: Normal rate, regular rhythm and normal heart sounds.  Exam reveals no gallop and no friction rub.   No murmur heard. Pulmonary/Chest: Effort normal and breath sounds normal. No respiratory distress. He has no rales.  Neurological: He is alert and oriented to person, place, and time.  Skin: Skin is warm and dry.       ED Course  Procedures (including critical care time)  Patient be referred to surgery for further evaluation and care of These multiple areas of abscess.  Patient is advised to return here as needed INCISION AND DRAINAGE Performed by: Carlyle Dolly Consent: Verbal consent obtained. Risks and benefits: risks, benefits and alternatives were discussed Type: abscess  Body area: Right upper medial thigh  Anesthesia: local infiltration  Incision was made with a scalpel.  Local anesthetic: lidocaine 2 % without epinephrine  Anesthetic total: 8 ml  Complexity: complex Blunt dissection to break up loculations  Drainage: purulent  Drainage amount: Large   Packing material: None Patient tolerance: Patient tolerated the procedure well with no immediate complications.  Large amount of drainage from the medial upper thigh abscess.  Patient is advised to soak in a warm bath several times a day   MDM          Carlyle Dolly, PA-C 06/29/12 1811

## 2012-06-29 NOTE — Progress Notes (Signed)
[facility] has seen patient and left him with a list of Primary Care Resources.

## 2012-11-14 ENCOUNTER — Emergency Department (HOSPITAL_COMMUNITY)
Admission: EM | Admit: 2012-11-14 | Discharge: 2012-11-14 | Disposition: A | Discharge status: Home / Self Care | Payer: PRIVATE HEALTH INSURANCE | Attending: Emergency Medicine | Admitting: Emergency Medicine

## 2012-11-14 ENCOUNTER — Encounter (HOSPITAL_COMMUNITY): Payer: Self-pay | Admitting: Emergency Medicine

## 2012-11-14 DIAGNOSIS — L732 Hidradenitis suppurativa: Secondary | ICD-10-CM

## 2012-11-14 DIAGNOSIS — F329 Major depressive disorder, single episode, unspecified: Secondary | ICD-10-CM | POA: Insufficient documentation

## 2012-11-14 DIAGNOSIS — Z872 Personal history of diseases of the skin and subcutaneous tissue: Secondary | ICD-10-CM | POA: Insufficient documentation

## 2012-11-14 DIAGNOSIS — Z79899 Other long term (current) drug therapy: Secondary | ICD-10-CM | POA: Insufficient documentation

## 2012-11-14 DIAGNOSIS — F17200 Nicotine dependence, unspecified, uncomplicated: Secondary | ICD-10-CM | POA: Insufficient documentation

## 2012-11-14 DIAGNOSIS — F3289 Other specified depressive episodes: Secondary | ICD-10-CM | POA: Insufficient documentation

## 2012-11-14 DIAGNOSIS — Z9889 Other specified postprocedural states: Secondary | ICD-10-CM | POA: Insufficient documentation

## 2012-11-14 MED ORDER — CLINDAMYCIN HCL 150 MG PO CAPS
300.0000 mg | ORAL_CAPSULE | Freq: Four times a day (QID) | ORAL | Status: DC
Start: 2012-11-14 — End: 2012-12-06

## 2012-11-14 MED ORDER — HYDROCODONE-ACETAMINOPHEN 5-325 MG PO TABS: 1.0000 | ORAL_TABLET | Freq: Four times a day (QID) | ORAL | Status: DC | PRN

## 2012-11-14 MED ORDER — SODIUM CHLORIDE 0.9 % IV BOLUS (SEPSIS)
500.0000 mL | Freq: Once | INTRAVENOUS | Status: AC
  Administered 2012-11-14: 500 mL via INTRAVENOUS

## 2012-11-14 MED ORDER — MORPHINE SULFATE 4 MG/ML IJ SOLN
4.0000 mg | Freq: Once | INTRAMUSCULAR | Status: AC
  Administered 2012-11-14: 4 mg via INTRAVENOUS
  Filled 2012-11-14: qty 1

## 2012-11-14 MED ORDER — CLINDAMYCIN PHOSPHATE 600 MG/50ML IV SOLN
600.0000 mg | Freq: Once | INTRAVENOUS | Status: AC
  Administered 2012-11-14: 600 mg via INTRAVENOUS
  Filled 2012-11-14: qty 50

## 2012-11-14 NOTE — ED Notes (Signed)
Abscess under arms draining x 2 months, drainage from abscesses on buttocks x 1 month. Testicular abscess x 6 hrs

## 2012-11-14 NOTE — Discharge Instructions (Signed)
   Hidradenitis Suppurativa, Sweat Gland Abscess Hidradenitis suppurativa is a long lasting (chronic), uncommon disease of the sweat glands. With this, boil-like lumps and scarring develop in the groin, some times under the arms (axillae), and under the breasts. It may also uncommonly occur behind the ears, in the crease of the buttocks, and around the genitals.  CAUSES  The cause is from a blocking of the sweat glands. They then become infected. It may cause drainage and odor. It is not contagious. So it cannot be given to someone else. It most often shows up in puberty (about 10 to 30 years of age). But it may happen much later. It is similar to acne which is a disease of the sweat glands. This condition is slightly more common in African-Americans and women. SYMPTOMS   Hidradenitis usually starts as one or more red, tender, swellings in the groin or under the arms (axilla).  Over a period of hours to days the lesions get larger. They often open to the skin surface, draining clear to yellow-colored fluid.  The infected area heals with scarring. DIAGNOSIS  Your caregiver makes this diagnosis by looking at you. Sometimes cultures (growing germs on plates in the lab) may be taken. This is to see what germ (bacterium) is causing the infection.  TREATMENT   Topical germ killing medicine applied to the skin (antibiotics) are the treatment of choice. Antibiotics taken by mouth (systemic) are sometimes needed when the condition is getting worse or is severe.  Avoid tight-fitting clothing which traps moisture in.  Dirt does not cause hidradenitis and it is not caused by poor hygiene.  Involved areas should be cleaned daily using an antibacterial soap. Some patients find that the liquid form of Lever 2000, applied to the involved areas as a lotion after bathing, can help reduce the odor related to this condition.  Sometimes surgery is needed to drain infected areas or remove scarred tissue.  Removal of large amounts of tissue is used only in severe cases.  Birth control pills may be helpful.  Oral retinoids (vitamin A derivatives) for 6 to 12 months which are effective for acne may also help this condition.  Weight loss will improve but not cure hidradenitis. It is made worse by being overweight. But the condition is not caused by being overweight.  This condition is more common in people who have had acne.  It may become worse under stress. There is no medical cure for hidradenitis. It can be controlled, but not cured. The condition usually continues for years with periods of getting worse and getting better (remission). Document Released: 09/12/2003 Document Revised: 04/22/2011 Document Reviewed: 09/28/2007 ExitCare Patient Information 2014 [employer].    RESOURCE GUIDE  Chronic Pain Problems:  Contact [facility] [phone]  Patients need to be referred by their primary care doctor.  Insufficient Money for Medicine:  Contact United Way: call (888) 892-1162  No Primary Care Doctor:  Call Health Connect [phone] - can help you locate a primary care doctor that accepts your insurance, provides certain services, etc.  Physician Referral Service- 1-800-533-3463 Agencies that provide inexpensive medical care:  [facility] [phone]  [facility] [phone]  [facility] [phone]  [facility] [phone]  Planned Parenthood [phone]  [facility] [phone] Medicaid-accepting [place] Providers:  [facility]- [address] [phone], Mon-Fri 9am-7pm, Sat 9am-1pm  [facility]- [address], [phone]  [facility]- [address], [phone]  [facility]- [address] [place] [address] [phone]  Veita Bland- [address], [phone] Only accepts [place] Access Medicaid  patients after they have their name applied to their card  Self Pay (no insurance) in [place]:  Sickle Cell Patients - [facility] [address], [phone]  [facility]- [address] [phone]  - [facility]- [address]  - [facility]- see information above (Speak to Pam H if you do not have insurance)  - [facility] [place]- [address], [phone]  - [facility]- [address] [place] [address], [phone]  - Dr Osei-Bonsu- [address], [place], [phone]  - [facility] - [address], [phone]  - [facility]- [address] [place] [address], [phone], also [address], [phone]  - [facility]- [address], [phone], 1st & 3rd Saturday  every month, 10am-1pm  - [facility]  [address], [place].  Phone: [phone], Fax: [phone]. Hours of Operation: 9 am - 6 pm, M-F.  - [facility]  [address], [place]  Phone: [phone], Fax: [phone]. Hours of Operation: 8:30 am - 5:30 pm, M-F.  [facility]  [address]  [place], [place] [address]  ([phone]) [phone]  [facility]  [address]  [place], [place] [address]  ([phone]) [phone]  1) Find a Doctor and Pay Out of Pocket  Although you won't have to find out who is covered by your insurance plan, it is a good idea to ask around and get recommendations. You will then need to call the office and see if the doctor you have chosen will accept you as a new patient and what types of options they offer for patients who are self-pay. Some doctors offer discounts or will set up payment plans for their patients who do not have insurance, but you will need to ask so you aren't surprised when you get to your appointment.  2) Contact Your Local Health Department  Not all health  departments have doctors that can see patients for sick visits, but many do, so it is worth a call to see if yours does. If you don't know where your local health department is, you can check in your phone book. The CDC also has a tool to help you locate your state's health department, and many state websites also have listings of all of their local health departments.  3) Find a Walk-in Clinic  If your illness is not likely to be very severe or complicated, you may want to try a walk in clinic. These are popping up all over the country in pharmacies, drugstores, and shopping centers. They're usually staffed by nurse practitioners or physician assistants that have been trained to treat common illnesses and complaints. They're usually fairly quick and inexpensive. However, if you have serious medical issues or chronic medical problems, these are probably not your best option  STD Testing  [place] [facility] [place], STD Clinic, [address], [place], phone [phone] or [phone]. Monday - Friday, call for an appointment.  [place] [facility] [place], STD Clinic, [address], [place], phone [phone]   or [phone]. Monday - Friday, call for an appointment. Abuse/Neglect:  [place] Child Abuse Hotline (336) 641-3795  [place] Child Abuse Hotline 800-378-5315 (After Hours) Emergency Shelter: [facility] (336) 271-5985  Maternity Homes:  [facility] (336) 275-9566  [facility] (704) 372-4663 MRSA Hotline #: 832-7006  Dental Assistance  If unable to pay or uninsured, contact: [place] [facility] to become qualified for the adult dental clinic.  Patients with Medicaid: [facility]  [address], [phone]  [address], [phone]  If unable to pay, or uninsured, contact [place] [facility] ([phone] in  [place], [phone] in [place]) to become qualified for the adult dental clinic  [facility]  [address]  [place], [place] [address]  (336) 272-4177  www.drcivils.com  Other Low-Cost Community Dental Services:  [facility]- [address], [place], [place], [address], [phone], Ext. 123, 2nd and 4th Thursday of the month at 6:30am. 10 clients each day by appointment, can sometimes see walk-in patients if someone does not show for an appointment.  [facility]- [address], [place], [place], [address], [phone]  [facility]- [address], [place], [place], [address], [phone]  [place] [facility]- [phone]  [place] [facility]- [phone]  [place] [facility]- [phone] Behavioral Health Resources in the Community  Intensive Outpatient Programs:  [place] [facility]  [address]  [place], [place]  336-878-6098  Both a day and evening program  [facility]  [address]  [place], [place] [address]  336-832-9800  [facility]  [address]  [place]  336-882-2125  [place] [facility]  ACCESS LINE: 1-800-853-5163 or 336-641-4981  [address]  [place], [place] [address]  Http://www.[facility].com/services/adult.htm  Substance Abuse Resources:  [facility] 336-882-2125  [facility] 336-784-9470  [facility] 336-285-9073  [facility] 336-845-3988  Residential & Outpatient Substance Abuse Program 800-659-3381 Psychological Services:  [facility] [phone]  [facility] [phone]  [place] [facility], [address], [place], ACCESS LINE: 1-800-853-5163 or 336-641-4981, Http://www.[facility].com/services/adult.htm Mobile Crisis Teams:  [facility]  Mobile Crisis Care Unit  1-877-626-1772  Assertive   Psychotherapeutic Services  [address] [place]  336-834-9664  Interventionist  Sharon DeEsch  [address]  [place]  [phone]  Self-Help/Support Groups:  [facility] [place] Variety of support groups  373-1402 (call for more info)  Narcotics Anonymous (NA)  [facility]  [address]  [place] - 2 meetings at this location  Residential Treatment Programs:  [facility]  [address]  [place]  866-801-8205  [facility]  [address]  [place], [place] [address]  [phone]  [facility]  [address]  [place], [place] [address]  336-845-3988  Admissions: 8am-3pm M-F  [facility]  [address]  [place], [place] [address]  336-841-1104  [facility]  [address]  [place], [place]  [phone]  [facility]  [address]  [place], [place]  336-285-9073  [facility] - Intensive Outpatient  [address]  [place], [place]  [phone]  [facility] ([facility]  [address]  [place],   [phone]   or 336-784-9470  [facility]idential Treatment Services (RTS), Medicaid  [address]  [place], [place]  336-227-7417  [facility]  [address]  [place]  800-659-3381  [place] [facility] Resources:  [facility]- 1-888-581-9988  General Therapy  Julie Brannon, PhD  [address]  [place], [place] [address]  336-349-5553  Insurance  [facility]  [address]  [place], [place] [address]  336-349-4454  [facility]  [address] [place], [place] [address]  336-342-8316  Insurance/Medicaid/sponsorship through [facility]  [facility]  [address]  [place], [place] [address]  Therapy/tele-psych/case  336-342-8316  [facility]  [address]  [place], [place] [address]  Adolescent/group  home/case management  336-349-2233  Julia Brannon PhD  General therapy  Insurance  [phone]  Dr. Arfeen, Insurance, M-F  336- [phone]  [facility] [place] [employer] [place] [facility]  [address]  [place]  Phone: [phone] Phone: [phone] Phone: [phone]  [place] [facility], [phone]  [place] Services - [facility]- 1-888-581-9988 -  [facility] Center in [place], [address],  336-349-4454, Insurance  [place] Child Abuse Hotline  (336) 342-1394 or (336) 342-3537 (After Hours)     management (765)269-7940                                           Creola Corn PhD       General therapy       Insurance   [phone]         Dr. Lolly Mustache, [place], M-F [phone]  [facility] [place]  [employer] [facility] Dept. [address]                 [address]         [address]  [place] Reveal Phone:  [phone]                                  Phone:  [phone]                   Phone:  762-017-1013  [facility], [phone] - [facility] - [facility]- (251) 861-8522       -     [facility] in [place], [address]             [phone], Insurance  [place] Child Abuse Hotline 878-443-9407 or [phone] (After Hours)

## 2012-11-14 NOTE — ED Provider Notes (Signed)
CSN: [id number]     Arrival date & time 11/14/12  1205 History   First MD Initiated Contact with Patient 11/14/12 1212     Chief Complaint  Patient presents with  . Abscess    Multiple abscess on buttocks, under arms and testicles    (Consider location/radiation/quality/duration/timing/severity/associated sxs/prior Treatment) HPI  32 year old male with history of recurrent abscesses presents for evaluations of multiple abscesses. Patient reports having recurrent abscess ongoing for the past 9 years. He was first diagnosed with abscess on his right arm at age of 25 and had successful surgery to remove his sweat glands. For the past 9 years abscesses has been recurrent, appears to both armpits and also in the scrotal area. Reports having abscess with drainage to his left armpit for the past 2 months. Also has abscess drainage to his groin and buttock for the past one month. He has had multiple I&D procedures in the past. He has been taken antibiotic and pain medication as recent as a month ago however abscess never resolved. This morning he also noticed new abscess on his R scrotum that is very painful. Patient has been very careful not to use anything abrasive that can worsen abscess. He uses warm compress and use  sitz bath on a daily basis. However it has not helped. He denies fever, headache, chest pain, shortness of breath, abdominal pain, dysuria, hematuria, difficulty urinating. He admits to being a smoker and using marijuana on occasion. He is a social drinker.  Past Medical History  Diagnosis Date  . Depression   . Cyst 32 years old was first occurance    Pt has multiple cysts: L axila, groing, gluteous, scrotum   Past Surgical History  Procedure Laterality Date  . Kidney donation    . Tonsillectomy    . Tonsillectomy    . Boil    . Kidney surgery  March 29, 2003    patient gave mother one of his kidneys   Family History  Problem Relation Age of Onset  . Kidney failure Mother    . Hypertension Mother   . Hypertension Sister    History  Substance Use Topics  . Smoking status: Current Every Day Smoker  . Smokeless tobacco: Never Used  . Alcohol Use: Yes     Comment: occasionally    Review of Systems  All other systems reviewed and are negative.    Allergies  Iodine and Shrimp  Home Medications   Current Outpatient Rx  Name  Route  Sig  Dispense  Refill  . citalopram (CELEXA) 20 MG tablet   Oral   Take 20 mg by mouth daily.         Marland Kitchen ibuprofen (ADVIL,MOTRIN) 200 MG tablet   Oral   Take 200 mg by mouth every 6 (six) hours as needed for pain.          BP 143/87  Pulse 102  Temp(Src) 98.2 F (36.8 C) (Oral)  Resp 18  SpO2 100% Physical Exam  Nursing note and vitals reviewed. Constitutional: He appears well-developed and well-nourished. No distress.  HENT:  Head: Atraumatic.  Eyes: Conjunctivae are normal.  Neck: Neck supple.  Cardiovascular: Normal rate and regular rhythm.   Pulmonary/Chest: Effort normal.  Abdominal: Soft. There is no tenderness.  Neurological: He is alert.  Skin: Skin is warm.  Patient has multiple abscess noted to his left armpit, bilateral groin, and cutaneous abscess of scrotum. moderate tenderness to palpation. No erythema noted.  Psychiatric: He has a  normal mood and affect.    ED Course  Procedures (including critical care time)  1:25 PM Patient here with multiple abscess suggestive of hidradenitis suppurativa. He has to many small abscesses to benefit from I&D.  He has significant abscesses presentation.  Appears to be very uncomfortable.  Plan to give IV abx with clindamycin, pain medication and have to f/u closely with [facility] for further management.  Recommend avoid smoking or rec drug use.  Have proper diet and exercise, to avoid deodorant and uses antiperspirant only.  Pt agrees with plan.    Labs Review Labs Reviewed - No data to display Imaging Review No results found.  MDM    1. Hidradenitis suppurativa    BP 143/87  Pulse 102  Temp(Src) 98.2 F (36.8 C) (Oral)  Resp 18  SpO2 100%  I have reviewed nursing notes and vital signs.   I reviewed available ER/hospitalization records thought the EMR     Fayrene Helper, [place] 11/14/12 1512

## 2012-11-14 NOTE — ED Notes (Signed)
Pt has large red painful area in l/axilla, multiple weeping sores noted. Groin and testicle covered with multiple, painful,  open and closed small abscesses

## 2012-11-16 NOTE — ED Provider Notes (Signed)
Medical screening examination/treatment/procedure(s) were performed by non-physician practitioner and as supervising physician I was immediately available for consultation/collaboration.   Dreshaun Stene M Merci [place], DO 11/16/12 1254 

## 2012-12-06 ENCOUNTER — Emergency Department (HOSPITAL_COMMUNITY)
Admission: EM | Admit: 2012-12-06 | Discharge: 2012-12-06 | Disposition: A | Discharge status: Home / Self Care | Payer: PRIVATE HEALTH INSURANCE | Attending: Emergency Medicine | Admitting: Emergency Medicine

## 2012-12-06 DIAGNOSIS — Z9889 Other specified postprocedural states: Secondary | ICD-10-CM | POA: Insufficient documentation

## 2012-12-06 DIAGNOSIS — L02419 Cutaneous abscess of limb, unspecified: Secondary | ICD-10-CM

## 2012-12-06 DIAGNOSIS — F3289 Other specified depressive episodes: Secondary | ICD-10-CM | POA: Insufficient documentation

## 2012-12-06 DIAGNOSIS — Z791 Long term (current) use of non-steroidal anti-inflammatories (NSAID): Secondary | ICD-10-CM | POA: Insufficient documentation

## 2012-12-06 DIAGNOSIS — Z79899 Other long term (current) drug therapy: Secondary | ICD-10-CM | POA: Insufficient documentation

## 2012-12-06 DIAGNOSIS — F329 Major depressive disorder, single episode, unspecified: Secondary | ICD-10-CM | POA: Insufficient documentation

## 2012-12-06 DIAGNOSIS — Z872 Personal history of diseases of the skin and subcutaneous tissue: Secondary | ICD-10-CM | POA: Insufficient documentation

## 2012-12-06 DIAGNOSIS — F17200 Nicotine dependence, unspecified, uncomplicated: Secondary | ICD-10-CM | POA: Insufficient documentation

## 2012-12-06 DIAGNOSIS — IMO0002 Reserved for concepts with insufficient information to code with codable children: Secondary | ICD-10-CM | POA: Insufficient documentation

## 2012-12-06 MED ORDER — HYDROCODONE-ACETAMINOPHEN 5-325 MG PO TABS: 1.0000 | ORAL_TABLET | Freq: Three times a day (TID) | ORAL | Status: DC | PRN

## 2012-12-06 MED ORDER — SULFAMETHOXAZOLE-TMP DS 800-160 MG PO TABS: 1.0000 | ORAL_TABLET | Freq: Two times a day (BID) | ORAL | Status: AC

## 2012-12-06 MED ORDER — SULFAMETHOXAZOLE-TMP DS 800-160 MG PO TABS
1.0000 | ORAL_TABLET | Freq: Once | ORAL | Status: AC
  Administered 2012-12-06: 1 via ORAL
  Filled 2012-12-06: qty 1

## 2012-12-06 MED ORDER — HYDROCODONE-ACETAMINOPHEN 5-325 MG PO TABS
1.0000 | ORAL_TABLET | Freq: Once | ORAL | Status: AC
  Administered 2012-12-06: 1 via ORAL
  Filled 2012-12-06: qty 1

## 2012-12-06 NOTE — ED Notes (Signed)
Pt has infected sweat glands and has boil under left arm and bleeding times two days and wants to check it out due to it not stopping.

## 2012-12-06 NOTE — Progress Notes (Signed)
   CARE MANAGEMENT NOTE 12/06/2012  Patient:  Adam Christian, Adam Christian   Account Number:  [id number]  Date Initiated:  12/06/2012  Documentation initiated by:  [facility]  Subjective/Objective Assessment:   cellulitis     Action/Plan:   lives at home with girlfriend, works part-time at [employer] DC Date:  12/06/2012   Anticipated DC Plan:  HOME/SELF CARE      DC Planning Services  CM consult  [facility]      Choice offered to / List presented to:             Status of service:  Completed, signed off Medicare Important Message given?   (If response is "NO", the following Medicare IM given date fields will be blank) Date Medicare IM given:   Date Additional Medicare IM given:    Discharge Disposition:  HOME/SELF CARE  Per UR Regulation:    If discussed at Long Length of Stay Meetings, dates discussed:    Comments:  12/06/2012 1500 NCM spoke to pt and states he works part-time. He needs a PCP. Provided pt with information on [employer] and [employer]. [facility] Monday to arrange follow up appt post dc. Contact info added to dc instructions.  Isidoro Donning RN CCM Case Mgmt phone [phone]

## 2012-12-06 NOTE — ED Provider Notes (Signed)
CSN: [id number]     Arrival date & time 12/06/12  1325 History   First MD Initiated Contact with Patient 12/06/12 1339     Chief Complaint  Patient presents with  . Recurrent Skin Infections   HPI Patient presents with concerns actively draining wound in the left axilla.  This drainage began approximately 3 days ago.  Since onset there has been ongoing mild bleeding.  There is concurrent.  Drainage from multiple other abscessess in the axilla. Patient has some relief with pressure application. There is no concurrent chest pain, belly pain, dyspnea, headache, fevers, chills, syncope.  Patient has had previous surgical repair of hidradenitis suprativa on the right side.  He has no insurance, has been unable to secure surgical followup for repeat evaluation of the left sided lesions.  Past Medical History  Diagnosis Date  . Depression   . Cyst 32 years old was first occurance    Pt has multiple cysts: L axila, groing, gluteous, scrotum   Past Surgical History  Procedure Laterality Date  . Kidney donation    . Tonsillectomy    . Tonsillectomy    . Boil    . Kidney surgery  March 29, 2003    patient gave mother one of his kidneys   Family History  Problem Relation Age of Onset  . Kidney failure Mother   . Hypertension Mother   . Hypertension Sister    History  Substance Use Topics  . Smoking status: Current Every Day Smoker  . Smokeless tobacco: Never Used  . Alcohol Use: Yes     Comment: occasionally    Review of Systems  Constitutional:       Per HPI, otherwise negative  HENT:       Per HPI, otherwise negative  Respiratory:       Per HPI, otherwise negative  Cardiovascular:       Per HPI, otherwise negative  Gastrointestinal: Negative for vomiting.  Endocrine:       Negative aside from HPI  Genitourinary:       Neg aside from HPI   Musculoskeletal:       Per HPI, otherwise negative  Skin: Positive for wound.  Neurological: Negative for syncope.     Allergies  Iodine and Shrimp  Home Medications   Current Outpatient Rx  Name  Route  Sig  Dispense  Refill  . citalopram (CELEXA) 20 MG tablet   Oral   Take 20 mg by mouth daily.         Marland Kitchen ibuprofen (ADVIL,MOTRIN) 200 MG tablet   Oral   Take 200 mg by mouth every 6 (six) hours as needed for pain.         . naproxen (NAPROSYN) 250 MG tablet   Oral   Take 500 mg by mouth 2 (two) times daily with a meal.         . HYDROcodone-acetaminophen (NORCO/VICODIN) 5-325 MG per tablet   Oral   Take 1 tablet by mouth every 8 (eight) hours as needed for pain.   20 tablet   0   . sulfamethoxazole-trimethoprim (BACTRIM DS) 800-160 MG per tablet   Oral   Take 1 tablet by mouth 2 (two) times daily.   14 tablet   0    BP 125/80  Pulse 97  Temp(Src) 98.5 F (36.9 C) (Oral)  Resp 18  SpO2 100% Physical Exam  Nursing note and vitals reviewed. Constitutional: He is oriented to person, place, and time. He appears  well-developed. No distress.  HENT:  Head: Normocephalic and atraumatic.  Eyes: Conjunctivae and EOM are normal.  Cardiovascular: Normal rate and regular rhythm.   Pulmonary/Chest: Effort normal. No stridor. No respiratory distress.  Abdominal: He exhibits no distension.  Musculoskeletal: He exhibits no edema.       Left elbow: Normal.       Arms: Neurological: He is alert and oriented to person, place, and time.  Skin:     Psychiatric: He has a normal mood and affect.    ED Course  Procedures (including critical care time) Labs Review Labs Reviewed - No data to display Imaging Review No results found.  EKG Interpretation   None       MDM   1. Axillary abscess    Patient with hidradenitis Superitiva presents with ongoing drainage from a left axillary abscess is oriented bleeding from a single area in the left axilla.  On exam the patient is awake and alert, hemodynamic stable. I had a lengthy conversation with him and his friend about options for  definitive care.   With no distress, and with stable VS, he was appropriate for d/c with outpatient f/u.   Gerhard Munch, MD 12/06/12 1504

## 2012-12-06 NOTE — ED Notes (Signed)
Pt presents with several abscesses to left axilla.  One of them has broken open and is producing bloody discharge.  Pt has a good pulse in left upper extremity, but states he sometimes has numbness in left hand.  The area immediately around broken abscess isn't warm to touch, but pt has abscessed area on left medial upper arm that is warm to touch.  Pt also has abscess on left chest.  Pt states he has hx of abscesses of sweat glands and is trying to have surgery to remove sweat glands.  Pt denies N/V and fever.

## 2013-04-24 ENCOUNTER — Encounter (HOSPITAL_COMMUNITY): Payer: Self-pay | Admitting: Emergency Medicine

## 2013-04-24 ENCOUNTER — Emergency Department (HOSPITAL_COMMUNITY)
Admission: EM | Admit: 2013-04-24 | Discharge: 2013-04-24 | Disposition: A | Discharge status: Home / Self Care | Payer: BC Managed Care – PPO | Attending: Emergency Medicine | Admitting: Emergency Medicine

## 2013-04-24 DIAGNOSIS — IMO0002 Reserved for concepts with insufficient information to code with codable children: Secondary | ICD-10-CM | POA: Insufficient documentation

## 2013-04-24 DIAGNOSIS — F17200 Nicotine dependence, unspecified, uncomplicated: Secondary | ICD-10-CM | POA: Insufficient documentation

## 2013-04-24 DIAGNOSIS — L02412 Cutaneous abscess of left axilla: Secondary | ICD-10-CM

## 2013-04-24 DIAGNOSIS — Z8659 Personal history of other mental and behavioral disorders: Secondary | ICD-10-CM | POA: Insufficient documentation

## 2013-04-24 DIAGNOSIS — L732 Hidradenitis suppurativa: Secondary | ICD-10-CM | POA: Insufficient documentation

## 2013-04-24 MED ORDER — SULFAMETHOXAZOLE-TMP DS 800-160 MG PO TABS
1.0000 | ORAL_TABLET | Freq: Once | ORAL | Status: AC
  Administered 2013-04-24: 1 via ORAL
  Filled 2013-04-24: qty 1

## 2013-04-24 MED ORDER — OXYCODONE-ACETAMINOPHEN 5-325 MG PO TABS
1.0000 | ORAL_TABLET | Freq: Four times a day (QID) | ORAL | Status: DC | PRN
Start: 2013-04-24 — End: 2014-02-02

## 2013-04-24 MED ORDER — SULFAMETHOXAZOLE-TMP DS 800-160 MG PO TABS: 1.0000 | ORAL_TABLET | Freq: Two times a day (BID) | ORAL | Status: DC

## 2013-04-24 MED ORDER — CEPHALEXIN 500 MG PO CAPS
500.0000 mg | ORAL_CAPSULE | Freq: Once | ORAL | Status: AC
  Administered 2013-04-24: 500 mg via ORAL
  Filled 2013-04-24: qty 1

## 2013-04-24 MED ORDER — CEPHALEXIN 500 MG PO CAPS: 500.0000 mg | ORAL_CAPSULE | Freq: Four times a day (QID) | ORAL | Status: DC

## 2013-04-24 MED ORDER — OXYCODONE-ACETAMINOPHEN 5-325 MG PO TABS
1.0000 | ORAL_TABLET | Freq: Once | ORAL | Status: AC
  Administered 2013-04-24: 1 via ORAL
  Filled 2013-04-24: qty 1

## 2013-04-24 NOTE — ED Notes (Signed)
He c/o much drainage and pain from multiple areas left axilla and further c/o painful rash at groin area.  He states he has an impending operation to treat this "but it got so bad I had to come here today".  He is in no distress.

## 2013-04-24 NOTE — ED Provider Notes (Signed)
CSN: [id number]     Arrival date & time 04/24/13  1404 History   First MD Initiated Contact with Patient 04/24/13 1419     Chief Complaint  Patient presents with  . Rash     (Consider location/radiation/quality/duration/timing/severity/associated sxs/prior Treatment) HPI  33 year old male with history of recurrent abscess who presents with complaints of painful abscess.  Pt sts he has been having reccurrent abscess to L axillary region for more than a year.  It has been waxing and waning but for the past 3-4 months it has been getting progressively worse.  The wound is painful, sharp, spreading towards his L upper chest, is actively weeping and increasing pain with arm movement.  Report having chills for 4 months.  Pain improves with taking NSAIDs.  Denies fever, sob, productive cough, abd pain, n/v/d, or recent trauma.  Has had surgery to R axillary in the past for same and it has helped.  He recently got insurance and plan on contacting surgeon on Monday but would like to be seen sooner.    Past Medical History  Diagnosis Date  . Depression   . Cyst 33 years old was first occurance    Pt has multiple cysts: L axila, groing, gluteous, scrotum   Past Surgical History  Procedure Laterality Date  . Kidney donation    . Tonsillectomy    . Tonsillectomy    . Boil    . Kidney surgery  March 29, 2003    patient gave mother one of his kidneys   Family History  Problem Relation Age of Onset  . Kidney failure Mother   . Hypertension Mother   . Hypertension Sister    History  Substance Use Topics  . Smoking status: Current Every Day Smoker  . Smokeless tobacco: Never Used  . Alcohol Use: Yes     Comment: occasionally    Review of Systems  Constitutional: Positive for chills. Negative for fever.  Respiratory: Negative for cough.   Cardiovascular: Negative for chest pain.  Gastrointestinal: Negative for abdominal pain.  Skin: Positive for rash and wound.  All other systems  reviewed and are negative.      Allergies  Iodine and Shrimp  Home Medications   Current Outpatient Rx  Name  Route  Sig  Dispense  Refill  . ibuprofen (ADVIL,MOTRIN) 200 MG tablet   Oral   Take 800 mg by mouth every 6 (six) hours as needed for pain.          . naproxen (NAPROSYN) 250 MG tablet   Oral   Take 500 mg by mouth 2 (two) times daily as needed for mild pain.           BP 123/77  Pulse 68  Temp(Src) 98.2 F (36.8 C) (Oral)  Resp 16  SpO2 100% Physical Exam  Constitutional: He appears well-developed and well-nourished. No distress.  HENT:  Head: Atraumatic.  Eyes: Conjunctivae are normal.  Neck: Normal range of motion. Neck supple.  Cardiovascular: Normal rate and regular rhythm.   Pulmonary/Chest: Effort normal and breath sounds normal.  Abdominal: Soft. There is no tenderness.  Neurological: He is alert.  Skin:  L axillary: extensive area of induration and confluence fluctuance nodes throughout axillary with 4 areas of skin ulcerations with pustular exudates.  Exquisitely tender to palpation.  Multiple old scars noted.  No overlying erythema.      Psychiatric: He has a normal mood and affect.    ED Course  Procedures (including critical care  time)  3:22 PM Hx of hidradenitis suppurativa with multiple loculated abscesses to L axillary, which will benefit from surgical intervention.  No overlying cellulitis.  No systemic involvement.  Pt will need to be manage by [facility] for definitive care.  Will provide pain medication, abx and care instruction.  [facility] referral given.  Care discussed with Dr. Silverio Lay.   Labs Review Labs Reviewed - No data to display Imaging Review No results found.   EKG Interpretation None      MDM   Final diagnoses:  Abscess of axilla, left  Hidradenitis suppurativa of left axilla    BP 123/77  Pulse 68  Temp(Src) 98.2 F (36.8 C) (Oral)  Resp 16  SpO2 100%     Fayrene Helper, PA-C 04/24/13 1550

## 2013-04-24 NOTE — Discharge Instructions (Signed)
Take pain medication, antibiotics and apply warm compress regularly to wound to help decrease infection.  Follow up closely with surgery to have your wound care.    Hidradenitis Suppurativa, Sweat Gland Abscess Hidradenitis suppurativa is a long lasting (chronic), uncommon disease of the sweat glands. With this, boil-like lumps and scarring develop in the groin, some times under the arms (axillae), and under the breasts. It may also uncommonly occur behind the ears, in the crease of the buttocks, and around the genitals.  CAUSES  The cause is from a blocking of the sweat glands. They then become infected. It may cause drainage and odor. It is not contagious. So it cannot be given to someone else. It most often shows up in puberty (about 3910 to 31 years of age). But it may happen much later. It is similar to acne which is a disease of the sweat glands. This condition is slightly more common in African-Americans and women. SYMPTOMS   Hidradenitis usually starts as one or more red, tender, swellings in the groin or under the arms (axilla).  Over a period of hours to days the lesions get larger. They often open to the skin surface, draining clear to yellow-colored fluid.  The infected area heals with scarring. DIAGNOSIS  Your caregiver makes this diagnosis by looking at you. Sometimes cultures (growing germs on plates in the lab) may be taken. This is to see what germ (bacterium) is causing the infection.  TREATMENT   Topical germ killing medicine applied to the skin (antibiotics) are the treatment of choice. Antibiotics taken by mouth (systemic) are sometimes needed when the condition is getting worse or is severe.  Avoid tight-fitting clothing which traps moisture in.  Dirt does not cause hidradenitis and it is not caused by poor hygiene.  Involved areas should be cleaned daily using an antibacterial soap. Some patients find that the liquid form of Lever 2000, applied to the involved areas as a  lotion after bathing, can help reduce the odor related to this condition.  Sometimes surgery is needed to drain infected areas or remove scarred tissue. Removal of large amounts of tissue is used only in severe cases.  Birth control pills may be helpful.  Oral retinoids (vitamin A derivatives) for 6 to 12 months which are effective for acne may also help this condition.  Weight loss will improve but not cure hidradenitis. It is made worse by being overweight. But the condition is not caused by being overweight.  This condition is more common in people who have had acne.  It may become worse under stress. There is no medical cure for hidradenitis. It can be controlled, but not cured. The condition usually continues for years with periods of getting worse and getting better (remission). Document Released: 09/12/2003 Document Revised: 04/22/2011 Document Reviewed: 09/28/2007 [facility] Patient Information 2014 [place], [place].

## 2013-04-24 NOTE — ED Provider Notes (Signed)
Medical screening examination/treatment/procedure(s) were performed by non-physician practitioner and as supervising physician I was immediately available for consultation/collaboration.   EKG Interpretation None        David H Yao, MD 04/24/13 2322 

## 2013-05-05 ENCOUNTER — Ambulatory Visit (INDEPENDENT_AMBULATORY_CARE_PROVIDER_SITE_OTHER): Payer: BC Managed Care – PPO | Admitting: General Surgery

## 2013-05-05 ENCOUNTER — Telehealth (INDEPENDENT_AMBULATORY_CARE_PROVIDER_SITE_OTHER): Payer: Self-pay | Admitting: General Surgery

## 2013-05-05 ENCOUNTER — Encounter (INDEPENDENT_AMBULATORY_CARE_PROVIDER_SITE_OTHER): Payer: Self-pay | Admitting: General Surgery

## 2013-05-05 VITALS — BP 126/80 | HR 78 | Temp 97.8°F | Ht 65.0 in | Wt 168.0 lb

## 2013-05-05 DIAGNOSIS — L732 Hidradenitis suppurativa: Secondary | ICD-10-CM | POA: Insufficient documentation

## 2013-05-05 MED ORDER — SULFAMETHOXAZOLE-TMP DS 800-160 MG PO TABS: 1.0000 | ORAL_TABLET | Freq: Two times a day (BID) | ORAL | Status: DC

## 2013-05-05 NOTE — Patient Instructions (Signed)
See patient handout.  Continue antibiotics until surgery.

## 2013-05-05 NOTE — Progress Notes (Signed)
Chief Complaint  Patient presents with  . eval hidradentitius    new pt    HISTORY:  Adam Christian is a 33 y.o. male who presents to clinic with hidradenitis of his L axilla and groin.  The patient states that this first occurred when he was a teenager. He underwent surgical resection of his right axilla approximately 15 years ago. Approximately 10 years ago he developed disease in his left axilla. He has been on and off antibiotics for the last 10 years. The area never completely heals. He now cannot come off antibiotics at all without severe inflammation and abscess formation. He is having a significant amount of pain that affects his daily activities and limits his working ability. He is a smoker but has been trying to cut back over the last few months.  Past Medical History  Diagnosis Date  . Depression   . Cyst 33 years old was first occurance    Pt has multiple cysts: L axila, groing, gluteous, scrotum       Past Surgical History  Procedure Laterality Date  . Kidney donation    . Tonsillectomy    . Tonsillectomy    . Boil    . Kidney surgery  March 29, 2003    patient gave mother one of his kidneys      Current Outpatient Prescriptions  Medication Sig Dispense Refill  . ibuprofen (ADVIL,MOTRIN) 200 MG tablet Take 800 mg by mouth every 6 (six) hours as needed for pain.       . naproxen (NAPROSYN) 250 MG tablet Take 500 mg by mouth 2 (two) times daily as needed for mild pain.       Marland Kitchen. oxyCODONE-acetaminophen (PERCOCET) 5-325 MG per tablet Take 1 tablet by mouth every 6 (six) hours as needed for severe pain.  20 tablet  0  . sulfamethoxazole-trimethoprim (BACTRIM DS) 800-160 MG per tablet Take 1 tablet by mouth 2 (two) times daily.  60 tablet  1   No current facility-administered medications for this visit.     Allergies  Allergen Reactions  . Iodine Anaphylaxis  . Shrimp [Shellfish Allergy] Anaphylaxis      Family History  Problem Relation Age of Onset  . Kidney  failure Mother   . Hypertension Mother   . Hypertension Sister       History   Social History  . Marital Status: Single    Spouse Name: N/A    Number of Children: N/A  . Years of Education: N/A   Social History Main Topics  . Smoking status: Current Every Day Smoker  . Smokeless tobacco: Never Used  . Alcohol Use: Yes     Comment: occasionally  . Drug Use: 14.00 per week    Special: Marijuana     Comment: occasionally  . Sexual Activity: Yes    Birth Control/ Protection: Condom   Other Topics Concern  . Not on file   Social History Narrative  . No narrative on file       REVIEW OF SYSTEMS - PERTINENT POSITIVES ONLY: Review of Systems - Hematological and Lymphatic ROS: negative for - bleeding problems Respiratory ROS: no cough, shortness of breath, or wheezing Cardiovascular ROS: no chest pain or dyspnea on exertion Gastrointestinal ROS: no abdominal pain, change in bowel habits, or black or bloody stools Genito-Urinary ROS: no dysuria, trouble voiding, or hematuria  EXAM: Filed Vitals:   05/05/13 0923  BP: 126/80  Pulse: 78  Temp: 97.8 F (36.6 C)  General appearance: alert and cooperative Resp: clear to auscultation bilaterally Cardio: regular rate and rhythm GI: normal findings: soft, non-tender Left axilla with multiple draining sinuses from his lateral chest wall across his axilla and onto his back. Extensive area of induration noted.  All sinuses draining purulence. Groin area: hidradenitis noticed in multiple areas extending up his left inguinal region and left side of the scrotum. These lesions extend bilaterally down to the anterior perianal region.  LABORATORY RESULTS: Available labs are reviewed  Lab Results  Component Value Date   WBC 10.9* 12/20/2010   HGB 11.6* 12/20/2010   HCT 36.3* 12/20/2010   MCV 80.3 12/20/2010   PLT 328 12/20/2010   Lab Results  Component Value Date   CREATININE 1.02 12/19/2010    ASSESSMENT AND PLAN: Adam Christian Is a 33 year old male with chronic left axillary and bilateral groin hidradenitis.  He states that his left axilla is the most painful and life limiting right now. He is doing sitz baths for his scrotal hydradenitis which seems to be controlling his symptoms there.  We discussed the surgical resection for hidradenitis. I recommended that he undergo separate resections for his axilla and groin regions. He would like to proceed with a left axillary region for now. We discussed that this can be a very painful surgery with prolonged healing. We discussed the need for possible skin graft. We discussed the difficulty with scarring afterwards. I believe he understands this, especially since he has had a resection on his right side.  I discussed with him the need to stop smoking completely. He seems to understand this well. He will call the office if he would like any medical assistance with this.  We will schedule the surgery at his convenience.    Vanita Panda, MD Colon and Rectal Surgery / General Surgery [facility]      Visit Diagnoses: 1. Hydradenitis     Primary Care Physician: PROVIDER NOT IN SYSTEM

## 2013-05-05 NOTE — Telephone Encounter (Signed)
Patient met with surgery scheduling, give financial responsibilities, patient will call back to schedule

## 2014-02-02 ENCOUNTER — Emergency Department (HOSPITAL_COMMUNITY)
Admission: EM | Admit: 2014-02-02 | Discharge: 2014-02-02 | Disposition: A | Discharge status: Home / Self Care | Payer: PRIVATE HEALTH INSURANCE | Attending: Emergency Medicine | Admitting: Emergency Medicine

## 2014-02-02 ENCOUNTER — Encounter (HOSPITAL_COMMUNITY): Payer: Self-pay | Admitting: Emergency Medicine

## 2014-02-02 DIAGNOSIS — Z720 Tobacco use: Secondary | ICD-10-CM | POA: Insufficient documentation

## 2014-02-02 DIAGNOSIS — R079 Chest pain, unspecified: Secondary | ICD-10-CM | POA: Insufficient documentation

## 2014-02-02 DIAGNOSIS — Z23 Encounter for immunization: Secondary | ICD-10-CM | POA: Insufficient documentation

## 2014-02-02 DIAGNOSIS — Z8659 Personal history of other mental and behavioral disorders: Secondary | ICD-10-CM | POA: Insufficient documentation

## 2014-02-02 DIAGNOSIS — L732 Hidradenitis suppurativa: Secondary | ICD-10-CM | POA: Insufficient documentation

## 2014-02-02 DIAGNOSIS — Z792 Long term (current) use of antibiotics: Secondary | ICD-10-CM | POA: Insufficient documentation

## 2014-02-02 HISTORY — DX: Hidradenitis suppurativa: L73.2

## 2014-02-02 MED ORDER — OXYCODONE-ACETAMINOPHEN 5-325 MG PO TABS: 1.0000 | ORAL_TABLET | Freq: Four times a day (QID) | ORAL | Status: DC | PRN

## 2014-02-02 MED ORDER — SULFAMETHOXAZOLE-TMP DS 800-160 MG PO TABS: 1.0000 | ORAL_TABLET | Freq: Two times a day (BID) | ORAL | Status: DC

## 2014-02-02 MED ORDER — LIDOCAINE-EPINEPHRINE (PF) 2 %-1:200000 IJ SOLN
10.0000 mL | Freq: Once | INTRAMUSCULAR | Status: AC
  Administered 2014-02-02: 10 mL via INTRADERMAL
  Filled 2014-02-02: qty 20

## 2014-02-02 MED ORDER — OXYCODONE-ACETAMINOPHEN 5-325 MG PO TABS
1.0000 | ORAL_TABLET | Freq: Once | ORAL | Status: AC
  Administered 2014-02-02: 1 via ORAL
  Filled 2014-02-02: qty 1

## 2014-02-02 MED ORDER — TETANUS-DIPHTH-ACELL PERTUSSIS 5-2.5-18.5 LF-MCG/0.5 IM SUSP
0.5000 mL | Freq: Once | INTRAMUSCULAR | Status: AC
  Administered 2014-02-02: 0.5 mL via INTRAMUSCULAR
  Filled 2014-02-02: qty 0.5

## 2014-02-02 NOTE — ED Provider Notes (Signed)
CSN: [id number]     Arrival date & time 02/02/14  1104 History   First MD Initiated Contact with Patient 02/02/14 1117     Chief Complaint  Patient presents with  . Abscess     (Consider location/radiation/quality/duration/timing/severity/associated sxs/prior Treatment) HPI   33 year old male with history of hidradenitis suppurativa presents for evaluations of multiple recurrent abscesses. Patient has had right adnexal abscess with surgical treatment in the past. He also had recurrent abscess to his left armpit which [facility], Dr. Romie LeveeAlicia Thomas who recommended surgery. Patient states that the surgery would cost him $10,000 and he is currently trying to save up money for it. Patient states his symptom has been well controlled but for the past 2 days he has noticed increasing pain to his left axillary region with increased swelling. Usually the abscess were drained however no significant drainage this time. No  fever, nausea vomiting diarrhea. Patient cannot recall his last tetanus shot.  Past Medical History  Diagnosis Date  . Depression   . Cyst 33 years old was first occurance    Pt has multiple cysts: L axila, groing, gluteous, scrotum  . Hydradenitis    Past Surgical History  Procedure Laterality Date  . Kidney donation    . Tonsillectomy    . Tonsillectomy    . Boil    . Kidney surgery  March 29, 2003    patient gave mother one of his kidneys   Family History  Problem Relation Age of Onset  . Kidney failure Mother   . Hypertension Mother   . Hypertension Sister    History  Substance Use Topics  . Smoking status: Current Every Day Smoker  . Smokeless tobacco: Never Used  . Alcohol Use: Yes     Comment: occasionally    Review of Systems  Constitutional: Negative for fever.  Cardiovascular: Positive for chest pain.  Skin: Positive for rash.  Neurological: Negative for numbness.      Allergies  Iodine and Shrimp  Home  Medications   Prior to Admission medications   Medication Sig Start Date End Date Taking? Authorizing Provider  ibuprofen (ADVIL,MOTRIN) 200 MG tablet Take 800 mg by mouth every 6 (six) hours as needed for pain.     Historical Provider, MD  naproxen (NAPROSYN) 250 MG tablet Take 500 mg by mouth 2 (two) times daily as needed for mild pain.     Historical Provider, MD  oxyCODONE-acetaminophen (PERCOCET) 5-325 MG per tablet Take 1 tablet by mouth every 6 (six) hours as needed for severe pain. 04/24/13   Fayrene HelperBowie Paetyn Pietrzak, PA-C  sulfamethoxazole-trimethoprim (BACTRIM DS) 800-160 MG per tablet Take 1 tablet by mouth 2 (two) times daily. 05/05/13   Romie LeveeAlicia Thomas, MD   BP 123/86 mmHg  Pulse 77  Temp(Src) 97.9 F (36.6 C) (Oral)  Resp 18  Ht 5\' 6"  (1.676 m)  Wt 168 lb (76.204 kg)  BMI 27.13 kg/m2  SpO2 98% Physical Exam  Constitutional: He appears well-developed and well-nourished. No distress.  HENT:  Head: Atraumatic.  Eyes: Conjunctivae are normal.  Neck: Normal range of motion. Neck supple.  Neurological: He is alert.  Skin: Rash (Left axillary region: Multiple abscess noted throughout axillary fold, actively oozing out pustular discharge, tender to palpation. Multiple scars noted with hyperpigmented skin changes. Cellulitic skin changes noted to axillary fold anteriorly ) noted.  Psychiatric: He has a normal mood and affect.    ED Course  Procedures (including critical care time)  Patient with hidradenitis suppurativa here with worsening abscess formation to his left armpit. He will best benefit from surgery however I will perform I and D today and update his tetanus. Patient will be discharge with pain medication and antibiotic. Patient was understanding and agrees with plan. Pain medication given.  12:47 PM INCISION AND DRAINAGE Performed by: Fayrene HelperRAN,Rockie Schnoor Consent: Verbal consent obtained. Risks and benefits: risks, benefits and alternatives were discussed Type: abscess  Body area: L  axillary region  Anesthesia: local infiltration  Incision was made with a scalpel.  Local anesthetic: lidocaine 2% w epinephrine  Anesthetic total: 4 ml  Complexity: complex Blunt dissection to break up loculations  Drainage: purulent  Drainage amount: moderate  Packing material: 1/4 in iodoform gauze  Patient tolerance: Patient tolerated the procedure well with no immediate complications.   12:56 PM I was able to express a large amount of pus or discharge from patient's left axillary abscess. Patient felt much better. Packing is placed. Patient will be discharge with pain medication and a long course of antibiotic given his severity of infection. Patient was instructed to remove the packing in 2-3 days and follow-up with [facility] for further management. Patient voiced understanding and agrees with plan. Return precautions discussed.  Labs Review Labs Reviewed - No data to display  Imaging Review No results found.   EKG Interpretation None      MDM   Final diagnoses:  Hidradenitis suppurativa of left axilla    BP 123/86 mmHg  Pulse 77  Temp(Src) 97.9 F (36.6 C) (Oral)  Resp 18  Ht 5\' 6"  (1.676 m)  Wt 168 lb (76.204 kg)  BMI 27.13 kg/m2  SpO2 98%     Fayrene HelperBowie Messi Twedt, PA-C 02/02/14 1257  Geoffery Lyonsouglas Delo, MD 02/02/14 [phone]

## 2014-02-02 NOTE — ED Notes (Addendum)
Pt from home for eval of multiple boils noted to left upper chest and underarm, pt with hx of hydrenitis and states boils normally drain and go away however this time it has not drained. Pt denies any n/v/d or fevers at home, reports increased pain to area. NAD, pt alert and oriented x4.

## 2014-02-02 NOTE — Discharge Instructions (Signed)
°  Please continue to apply warm compress to affected area.  Remove packing in 2-3 days.  [facility]  Take pain medication as needed for pain.    Hidradenitis Suppurativa, Sweat Gland Abscess Hidradenitis suppurativa is a long lasting (chronic), uncommon disease of the sweat glands. With this, boil-like lumps and scarring develop in the groin, some times under the arms (axillae), and under the breasts. It may also uncommonly occur behind the ears, in the crease of the buttocks, and around the genitals.  CAUSES  The cause is from a blocking of the sweat glands. They then become infected. It may cause drainage and odor. It is not contagious. So it cannot be given to someone else. It most often shows up in puberty (about 4110 to 31 years of age). But it may happen much later. It is similar to acne which is a disease of the sweat glands. This condition is slightly more common in African-Americans and women. SYMPTOMS   Hidradenitis usually starts as one or more red, tender, swellings in the groin or under the arms (axilla).  Over a period of hours to days the lesions get larger. They often open to the skin surface, draining clear to yellow-colored fluid.  The infected area heals with scarring. DIAGNOSIS  Your caregiver makes this diagnosis by looking at you. Sometimes cultures (growing germs on plates in the lab) may be taken. This is to see what germ (bacterium) is causing the infection.  TREATMENT   Topical germ killing medicine applied to the skin (antibiotics) are the treatment of choice. Antibiotics taken by mouth (systemic) are sometimes needed when the condition is getting worse or is severe.  Avoid tight-fitting clothing which traps moisture in.  Dirt does not cause hidradenitis and it is not caused by poor hygiene.  Involved areas should be cleaned daily using an antibacterial soap. Some patients find that the liquid form of Lever 2000,  applied to the involved areas as a lotion after bathing, can help reduce the odor related to this condition.  Sometimes surgery is needed to drain infected areas or remove scarred tissue. Removal of large amounts of tissue is used only in severe cases.  Birth control pills may be helpful.  Oral retinoids (vitamin A derivatives) for 6 to 12 months which are effective for acne may also help this condition.  Weight loss will improve but not cure hidradenitis. It is made worse by being overweight. But the condition is not caused by being overweight.  This condition is more common in people who have had acne.  It may become worse under stress. There is no medical cure for hidradenitis. It can be controlled, but not cured. The condition usually continues for years with periods of getting worse and getting better (remission). Document Released: 09/12/2003 Document Revised: 04/22/2011 Document Reviewed: 04/30/2013 [facility] Patient Information 2015 [place]ExitCare, [place]LL[place]. This information is not intended to replace advice given to you by your health care provider. Make sure you discuss any questions you have with your health care provider.

## 2014-06-02 ENCOUNTER — Encounter (HOSPITAL_COMMUNITY): Payer: Self-pay | Admitting: Emergency Medicine

## 2014-06-02 ENCOUNTER — Emergency Department (HOSPITAL_COMMUNITY)
Admission: EM | Admit: 2014-06-02 | Discharge: 2014-06-02 | Disposition: A | Discharge status: Home / Self Care | Payer: PRIVATE HEALTH INSURANCE | Attending: Emergency Medicine | Admitting: Emergency Medicine

## 2014-06-02 DIAGNOSIS — L732 Hidradenitis suppurativa: Secondary | ICD-10-CM

## 2014-06-02 DIAGNOSIS — F329 Major depressive disorder, single episode, unspecified: Secondary | ICD-10-CM | POA: Insufficient documentation

## 2014-06-02 DIAGNOSIS — Z792 Long term (current) use of antibiotics: Secondary | ICD-10-CM | POA: Insufficient documentation

## 2014-06-02 DIAGNOSIS — Z791 Long term (current) use of non-steroidal anti-inflammatories (NSAID): Secondary | ICD-10-CM | POA: Insufficient documentation

## 2014-06-02 DIAGNOSIS — Z720 Tobacco use: Secondary | ICD-10-CM | POA: Insufficient documentation

## 2014-06-02 MED ORDER — LIDOCAINE-EPINEPHRINE 2 %-1:100000 IJ SOLN
20.0000 mL | Freq: Once | INTRAMUSCULAR | Status: AC
  Administered 2014-06-02: 20 mL via INTRADERMAL
  Filled 2014-06-02: qty 1

## 2014-06-02 MED ORDER — SULFAMETHOXAZOLE-TRIMETHOPRIM 800-160 MG PO TABS: 1.0000 | ORAL_TABLET | Freq: Two times a day (BID) | ORAL | Status: DC

## 2014-06-02 MED ORDER — SULFAMETHOXAZOLE-TRIMETHOPRIM 800-160 MG PO TABS
1.0000 | ORAL_TABLET | Freq: Once | ORAL | Status: AC
  Administered 2014-06-02: 1 via ORAL
  Filled 2014-06-02: qty 1

## 2014-06-02 MED ORDER — HYDROCODONE-ACETAMINOPHEN 5-325 MG PO TABS: ORAL_TABLET | ORAL | Status: DC

## 2014-06-02 NOTE — ED Notes (Signed)
Pt reports recurrent abscess under both arms. Currently has swelling , tenderness and cellulitis under l/arm

## 2014-06-02 NOTE — Discharge Instructions (Signed)
Take a bath/shower TWICE daily for the first 3 days move the knot from one  Side to the other at least four times per day Remove the loop in 7-10 days (when the drainage stops and the overlying cellulitis resolves)  If you see signs of infection (warmth, redness, tenderness, pus, sharp increase in pain, fever, red streaking) immediately return to the emergency department.  Take your antibiotics as directed and to completion. You should never have any leftover antibiotics! Push fluids and stay well hydrated.   Do not hesitate to return to the Emergency Department for any new, worsening or concerning symptoms.   If you do not have a primary care doctor you can establish one at the   [facility]: [address] [place] [id number] [phone]  After you establish care. Let them know you were seen in the emergency room. They must obtain records for further management.    Hidradenitis Suppurativa, Sweat Gland Abscess Hidradenitis suppurativa is a long lasting (chronic), uncommon disease of the sweat glands. With this, boil-like lumps and scarring develop in the groin, some times under the arms (axillae), and under the breasts. It may also uncommonly occur behind the ears, in the crease of the buttocks, and around the genitals.  CAUSES  The cause is from a blocking of the sweat glands. They then become infected. It may cause drainage and odor. It is not contagious. So it cannot be given to someone else. It most often shows up in puberty (about 3110 to 32 years of age). But it may happen much later. It is similar to acne which is a disease of the sweat glands. This condition is slightly more common in African-Americans and women. SYMPTOMS   Hidradenitis usually starts as one or more red, tender, swellings in the groin or under the arms (axilla).  Over a period of hours to days the lesions get larger. They often open to the skin surface, draining clear to yellow-colored  fluid.  The infected area heals with scarring. DIAGNOSIS  Your caregiver makes this diagnosis by looking at you. Sometimes cultures (growing germs on plates in the lab) may be taken. This is to see what germ (bacterium) is causing the infection.  TREATMENT   Topical germ killing medicine applied to the skin (antibiotics) are the treatment of choice. Antibiotics taken by mouth (systemic) are sometimes needed when the condition is getting worse or is severe.  Avoid tight-fitting clothing which traps moisture in.  Dirt does not cause hidradenitis and it is not caused by poor hygiene.  Involved areas should be cleaned daily using an antibacterial soap. Some patients find that the liquid form of Lever 2000, applied to the involved areas as a lotion after bathing, can help reduce the odor related to this condition.  Sometimes surgery is needed to drain infected areas or remove scarred tissue. Removal of large amounts of tissue is used only in severe cases.  Birth control pills may be helpful.  Oral retinoids (vitamin A derivatives) for 6 to 12 months which are effective for acne may also help this condition.  Weight loss will improve but not cure hidradenitis. It is made worse by being overweight. But the condition is not caused by being overweight.  This condition is more common in people who have had acne.  It may become worse under stress. There is no medical cure for hidradenitis. It can be controlled, but not cured. The condition usually continues for years with periods of getting worse  and getting better (remission). Document Released: 09/12/2003 Document Revised: 04/22/2011 Document Reviewed: 04/30/2013 [facility] Patient Information 2015 [place], [place]. This information is not intended to replace advice given to you by your health care provider. Make sure you discuss any questions you have with your health care provider.

## 2014-06-02 NOTE — ED Provider Notes (Signed)
CSN: [id number]     Arrival date & time 06/02/14  1411 History  This chart was scribed for non-physician practitioner, Adam Christian, working with Adam Cole, MD by Adam Christian, ED Scribe. This patient was seen in room WTR9/WTR9 and the patient's care was started at 3:27 PM.  Chief Complaint  Patient presents with  . Abscess    chronic abscess under l/arm   HPI HPI Comments: Adam Christian is a 34 y.o. male with a history of cysts and hidradenitis who presents to the Emergency Department complaining of a recurrent abscess under his left arm. Pt reports associated swelling and tenderness under his left arm. He states that he has had recurrent cysts for the last 15 years. Pt states that he has had to had the area I&D'efrequently in the past. Pt denies drainage from the abscess recently. He denies fever, chills, nausea and vomiting.   Past Medical History  Diagnosis Date  . Depression   . Cyst 34 years old was first occurance    Pt has multiple cysts: L axila, groing, gluteous, scrotum  . Hydradenitis    Past Surgical History  Procedure Laterality Date  . Kidney donation    . Tonsillectomy    . Tonsillectomy    . Boil    . Kidney surgery  March 29, 2003    patient gave mother one of his kidneys   Family History  Problem Relation Age of Onset  . Kidney failure Mother   . Hypertension Mother   . Hypertension Sister    History  Substance Use Topics  . Smoking status: Current Every Day Smoker  . Smokeless tobacco: Never Used  . Alcohol Use: Yes     Comment: occasionally    Review of Systems  A complete 10 system review of systems was obtained and all systems are negative except as noted in the HPI and PMH.   Allergies  Iodine and Shrimp  Home Medications   Prior to Admission medications   Medication Sig Start Date End Date Taking? Authorizing Provider  citalopram (CELEXA) 20 MG tablet Take 20 mg by mouth daily.    Historical Provider, MD  ibuprofen  (ADVIL,MOTRIN) 200 MG tablet Take 800 mg by mouth every 6 (six) hours as needed for pain.     Historical Provider, MD  naproxen (NAPROSYN) 250 MG tablet Take 500 mg by mouth 2 (two) times daily as needed for mild pain.     Historical Provider, MD  oxyCODONE-acetaminophen (PERCOCET) 5-325 MG per tablet Take 1 tablet by mouth every 6 (six) hours as needed for severe pain. 02/02/14   Adam Helper, PA-C  sulfamethoxazole-trimethoprim (BACTRIM DS) 800-160 MG per tablet Take 1 tablet by mouth 2 (two) times daily. 02/02/14   Adam Helper, PA-C   BP 134/89 mmHg  Pulse 75  Temp(Src) 98.2 F (36.8 C) (Oral)  Resp 18  SpO2 100% Physical Exam  Constitutional: He is oriented to person, place, and time. He appears well-developed and well-nourished.  HENT:  Head: Normocephalic and atraumatic.  Eyes: Right eye exhibits no discharge. Left eye exhibits no discharge.  Neck: Neck supple. No tracheal deviation present.  Cardiovascular: Normal rate.   Pulmonary/Chest: Effort normal. No respiratory distress.  Abdominal: He exhibits no distension.  Neurological: He is alert and oriented to person, place, and time.  Skin: Skin is warm and dry.  Pulse scars and hits to left axilla. On the anterior side there is a 3.5 cm indurated abscess in a linear pattern. There is  no active drainage. No surrounding cellulitis.  Psychiatric: He has a normal mood and affect. His behavior is normal.  Nursing note and vitals reviewed.       ED Course  INCISION AND DRAINAGE Date/Time: 06/02/2014 7:31 PM Performed by: Adam EmeryPISCIOTTA, Adam Manfredi Authorized by: Adam EmeryPISCIOTTA, Keelan Pomerleau Consent: Verbal consent obtained. Consent given by: patient Patient identity confirmed: verbally with patient Type: abscess Body area: upper extremity (left axilla) Anesthesia: local infiltration Local anesthetic: lidocaine 2% with epinephrine Anesthetic total: 4 ml Scalpel size: 11 Incision type: 2 small punch incisions made on opposite ends of the most  fluctuant abscess. Complexity: complex Drainage: bloody Drainage amount: scant Wound treatment: drain placed (Loop drainage placed with small vessel tie) Patient tolerance: Patient tolerated the procedure well with no immediate complications     DIAGNOSTIC STUDIES: Oxygen Saturation is 100% on RA, normal by my interpretation.    COORDINATION OF CARE: 3:34 PM Discussed treatment plan with pt at bedside and pt agreed to plan.   Labs Review Labs Reviewed - No data to display  Imaging Review No results found.   EKG Interpretation None      MDM   Final diagnoses:  None    Filed Vitals:   06/02/14 1446 06/02/14 1718  BP: 134/89 123/79  Pulse: 75 78  Temp: 98.2 F (36.8 C) 98.3 F (36.8 C)  TempSrc: Oral Oral  Resp: 18 20  SpO2: 100% 98%    Medications  lidocaine-EPINEPHrine (XYLOCAINE W/EPI) 2 %-1:100000 (with pres) injection 20 mL (20 mLs Intradermal Given 06/02/14 1602)  sulfamethoxazole-trimethoprim (BACTRIM DS,SEPTRA DS) 800-160 MG per tablet 1 tablet (1 tablet Oral Given 06/02/14 1723)    Adam Christian is a pleasant 34 y.o. male presenting with new swelling to left axilla which is afflicted with hidradenitis suprativa. No signs of systemic infection. Loop drainage placed and patient is placed on Bactrim/norco. It had an extensive discussion as to how to care for the loop abscess including baths twice a day and moving the knot to either side at least 4 times a day. Patient is instructed on how to remove the loop after drainage has stopped antibiotics are completed or 10 days.  Evaluation does not show pathology that would require ongoing emergent intervention or inpatient treatment. Pt is hemodynamically stable and mentating appropriately. Discussed findings and plan with patient/guardian, who agrees with care plan. All questions answered. Return precautions discussed and outpatient follow up given.   I personally performed the services described in this  documentation, which was scribed in my presence. The recorded information has been reviewed and is accurate.      Adam Emeryicole Ludwin Flahive, PA-C 06/02/14 1945  Gerhard Munchobert Lockwood, MD 06/03/14 [phone]

## 2015-02-02 ENCOUNTER — Emergency Department (HOSPITAL_COMMUNITY): Payer: PRIVATE HEALTH INSURANCE

## 2015-02-02 ENCOUNTER — Encounter (HOSPITAL_COMMUNITY): Payer: Self-pay | Admitting: Emergency Medicine

## 2015-02-02 ENCOUNTER — Emergency Department (HOSPITAL_COMMUNITY)
Admission: EM | Admit: 2015-02-02 | Discharge: 2015-02-02 | Disposition: A | Discharge status: Home / Self Care | Payer: PRIVATE HEALTH INSURANCE | Attending: Emergency Medicine | Admitting: Emergency Medicine

## 2015-02-02 DIAGNOSIS — Z79899 Other long term (current) drug therapy: Secondary | ICD-10-CM | POA: Insufficient documentation

## 2015-02-02 DIAGNOSIS — I10 Essential (primary) hypertension: Secondary | ICD-10-CM | POA: Insufficient documentation

## 2015-02-02 DIAGNOSIS — R110 Nausea: Secondary | ICD-10-CM | POA: Insufficient documentation

## 2015-02-02 DIAGNOSIS — Z872 Personal history of diseases of the skin and subcutaneous tissue: Secondary | ICD-10-CM | POA: Insufficient documentation

## 2015-02-02 DIAGNOSIS — F329 Major depressive disorder, single episode, unspecified: Secondary | ICD-10-CM | POA: Insufficient documentation

## 2015-02-02 DIAGNOSIS — R1084 Generalized abdominal pain: Secondary | ICD-10-CM | POA: Insufficient documentation

## 2015-02-02 DIAGNOSIS — F17200 Nicotine dependence, unspecified, uncomplicated: Secondary | ICD-10-CM | POA: Insufficient documentation

## 2015-02-02 LAB — URINALYSIS, ROUTINE W REFLEX MICROSCOPIC
BILIRUBIN URINE: NEGATIVE
Glucose, UA: NEGATIVE mg/dL
Hgb urine dipstick: NEGATIVE
KETONES UR: NEGATIVE mg/dL
Leukocytes, UA: NEGATIVE
NITRITE: NEGATIVE
PROTEIN: NEGATIVE mg/dL
Specific Gravity, Urine: 1.008 (ref 1.005–1.030)
pH: 6 (ref 5.0–8.0)

## 2015-02-02 LAB — COMPREHENSIVE METABOLIC PANEL
ALT: 17 U/L (ref 17–63)
AST: 24 U/L (ref 15–41)
Albumin: 3.9 g/dL (ref 3.5–5.0)
Alkaline Phosphatase: 70 U/L (ref 38–126)
Anion gap: 9 (ref 5–15)
BUN: 14 mg/dL (ref 6–20)
CHLORIDE: 105 mmol/L (ref 101–111)
CO2: 24 mmol/L (ref 22–32)
Calcium: 8.8 mg/dL — ABNORMAL LOW (ref 8.9–10.3)
Creatinine, Ser: 1.03 mg/dL (ref 0.61–1.24)
GFR calc Af Amer: >60 mL/min (ref >60)
Glucose, Bld: 109 mg/dL — ABNORMAL HIGH (ref 65–99)
POTASSIUM: 3.9 mmol/L (ref 3.5–5.1)
Sodium: 138 mmol/L (ref 135–145)
Total Bilirubin: 0.6 mg/dL (ref 0.3–1.2)
Total Protein: 7.8 g/dL (ref 6.5–8.1)

## 2015-02-02 LAB — CBC
HEMATOCRIT: 37.9 % — AB (ref 39.0–52.0)
HEMOGLOBIN: 11.9 g/dL — AB (ref 13.0–17.0)
MCH: 25.7 pg — AB (ref 26.0–34.0)
MCHC: 31.4 g/dL (ref 30.0–36.0)
MCV: 81.9 fL (ref 78.0–100.0)
PLATELETS: 263 10*3/uL (ref 150–400)
RBC: 4.63 MIL/uL (ref 4.22–5.81)
RDW: 14.2 % (ref 11.5–15.5)
WBC: 7.2 10*3/uL (ref 4.0–10.5)

## 2015-02-02 LAB — LIPASE, BLOOD: LIPASE: 29 U/L (ref 11–51)

## 2015-02-02 MED ORDER — ONDANSETRON 8 MG PO TBDP: ORAL_TABLET | ORAL | Status: DC

## 2015-02-02 MED ORDER — ONDANSETRON 8 MG PO TBDP
8.0000 mg | ORAL_TABLET | Freq: Once | ORAL | Status: AC
  Administered 2015-02-02: 8 mg via ORAL
  Filled 2015-02-02: qty 1

## 2015-02-02 MED ORDER — DICYCLOMINE HCL 20 MG PO TABS: 20.0000 mg | ORAL_TABLET | Freq: Two times a day (BID) | ORAL | Status: DC

## 2015-02-02 MED ORDER — DICYCLOMINE HCL 10 MG PO CAPS
10.0000 mg | ORAL_CAPSULE | Freq: Once | ORAL | Status: AC
  Administered 2015-02-02: 10 mg via ORAL
  Filled 2015-02-02: qty 1

## 2015-02-02 NOTE — ED Notes (Signed)
Pt c/o mid abd pain without n/v/d that started last night.  Pt states that he donate one of his kidneys to his mother.

## 2015-02-02 NOTE — Discharge Instructions (Signed)
1. Medications: zofran, Bentyl, usual home medications 2. Treatment: rest, drink plenty of fluids, advance diet slowly 3. Follow Up: Please followup with your primary doctor in 2 days for discussion of your diagnoses and further evaluation after today's visit; if you do not have a primary care doctor use the resource guide provided to find one; Please return to the ER for persistent vomiting, high fevers or worsening symptoms   Abdominal Pain, Adult Many things can cause abdominal pain. Usually, abdominal pain is not caused by a disease and will improve without treatment. It can often be observed and treated at home. Your health care provider will do a physical exam and possibly order blood tests and X-rays to help determine the seriousness of your pain. However, in many cases, more time must pass before a clear cause of the pain can be found. Before that point, your health care provider may not know if you need more testing or further treatment. HOME CARE INSTRUCTIONS Monitor your abdominal pain for any changes. The following actions may help to alleviate any discomfort you are experiencing:  Only take over-the-counter or prescription medicines as directed by your health care provider.  Do not take laxatives unless directed to do so by your health care provider.  Try a clear liquid diet (broth, tea, or water) as directed by your health care provider. Slowly move to a bland diet as tolerated. SEEK MEDICAL CARE IF:  You have unexplained abdominal pain.  You have abdominal pain associated with nausea or diarrhea.  You have pain when you urinate or have a bowel movement.  You experience abdominal pain that wakes you in the night.  You have abdominal pain that is worsened or improved by eating food.  You have abdominal pain that is worsened with eating fatty foods.  You have a fever. SEEK IMMEDIATE MEDICAL CARE IF:  Your pain does not go away within 2 hours.  You keep throwing up  (vomiting).  Your pain is felt only in portions of the abdomen, such as the right side or the left lower portion of the abdomen.  You pass bloody or black tarry stools. MAKE SURE YOU:  Understand these instructions.  Will watch your condition.  Will get help right away if you are not doing well or get worse.   This information is not intended to replace advice given to you by your health care provider. Make sure you discuss any questions you have with your health care provider.   Document Released: 11/07/2004 Document Revised: 10/19/2014 Document Reviewed: 10/07/2012 Elsevier Interactive Patient Education 2016 [employer].    Emergency Department Resource Guide 1) Find a Doctor and Pay Out of Pocket Although you won't have to find out who is covered by your insurance plan, it is a good idea to ask around and get recommendations. You will then need to call the office and see if the doctor you have chosen will accept you as a new patient and what types of options they offer for patients who are self-pay. Some doctors offer discounts or will set up payment plans for their patients who do not have insurance, but you will need to ask so you aren't surprised when you get to your appointment.  2) Contact Your Local Health Department Not all health departments have doctors that can see patients for sick visits, but many do, so it is worth a call to see if yours does. If you don't know where your local health department is, you can check  in your phone book. The CDC also has a tool to help you locate your state's health department, and many state websites also have listings of all of their local health departments.  3) Find a Walk-in Clinic If your illness is not likely to be very severe or complicated, you may want to try a walk in clinic. These are popping up all over the country in pharmacies, drugstores, and shopping centers. They're usually staffed by nurse practitioners or physician  assistants that have been trained to treat common illnesses and complaints. They're usually fairly quick and inexpensive. However, if you have serious medical issues or chronic medical problems, these are probably not your best option.  No Primary Care Doctor: - Call Health Connect at  [phone] - they can help you locate a primary care doctor that  accepts your insurance, provides certain services, etc. - Physician Referral Service- [phone]  Chronic Pain Problems: Organization         Address  Phone   Notes  [facility]  [phone] Patients need to be referred by their primary care doctor.   Medication Assistance: Organization         Address  Phone   Notes  [facility] Medication [facility] [address] [place], [place] [address] [phone] --Must be a resident of [facility] -- Must have NO insurance coverage whatsoever (no Medicaid/ Medicare, etc.) -- The pt. MUST have a primary care doctor that directs their care regularly and follows them in the community   [facility]  [phone]   [employer]  [phone]    Agencies that provide inexpensive medical care: Organization         Address  Phone   Notes  [facility]  [phone]   [facility]    [phone]   [facility] [address] [place], [place] [address] [phone]   [facility] [address] [place], [place] [phone]   Planned Parenthood    [phone]   [facility]    [phone]   [facility]  [address], [place] Phone:  [phone], Fax:  [phone] Hours of Operation:  9 am - 6 pm, M-F.  Also accepts Medicaid/Medicare and self-pay.  [facility]  [address], [place] Phone: [phone], Fax: [phone]. Hours of Operation:  8:30 am - 5:30 pm, M-F.  Also accepts  Medicaid and self-pay.  [facility] [place] [address] [place] Phone: [phone]   [facility] [address] Natasha Bence [place], [place] [phone], Ext. [phone] Mondays & Thursdays: 7-9 AM.  First 15 patients are seen on a first come, first serve basis.    Medicaid-accepting [facility] Providers:  Organization         Address  Phone   Notes  [facility] [address], [employer] [phone] Also accepts self-pay patients.  [facility] [address]urell Josephs [place], [place]  [phone]   [facility] [address], [place] [phone]   [facility] [address] [place] [phone]   Renaye Rakers [address], [place]   [phone] Only accepts [place] Access [place] patients after they have their name applied to their  card.   Self-Pay (no insurance) in [facility]:  Organization         Address  Phone   Notes  Sickle Cell Patients, [facility] [address], [place] [phone]) [phone]   [facility] [address], [place] [phone]) [phone]   [facility]  [address], [place] [phone]   [facility]/Dr. Osei-Bonsu  [address] [place] [address], [place] or [phone], [place] [phone] Phone number for both [place] and [place] locations is the same.  [facility] [address], [place] [phone]) [phone]   [facility] [address] [place] [address], [place] or [address] [phone]) [phone]   [facility] [address], [place] [phone]) [phone], phone; [phone], fax Sees patients 1st and 3rd Saturday of every month.  Must not qualify for public or private insurance (i.e. Medicaid, Medicare, [place] Health Choice, Veterans' Benefits)  Household  income should be no more than 200% of the poverty level The clinic cannot treat you if you are pregnant or think you are pregnant  Sexually transmitted diseases are not treated at the clinic.    Dental Care: Organization         Address  Phone  Notes  [facility] [address], [place] [phone] Accepts children up to age 32 who are enrolled in [place] or [place] Health Choice; pregnant women with a Medicaid card; and children who have applied for Medicaid or [place] Health Choice, but were declined, whose parents can pay a reduced fee at time of service.  [facility] [place]  [address], [place] [phone] Accepts children up to age 32 who are enrolled in [place] or [place] Health Choice; pregnant women with a Medicaid card; and children who have applied for Medicaid or [place] Health Choice, but were declined, whose parents can pay a reduced fee at time of service.  [facility]  [address], [place] [phone]) [phone] Patients are seen by appointment only. Walk-ins are not accepted. [facility] will see patients 32 years of age and older. Monday - Tuesday (8am-5pm) Most Wednesdays (8:30-5pm) $30 per visit, cash only  [facility]  [address], [facility] [phone] Patients are seen by appointment only. Walk-ins are not accepted. [facility] will see patients 32 years of age and older. One Wednesday Evening (Monthly: Volunteer Based).  $30 per visit, cash only  [employer] [facility]  [phone]) [phone] for adults; Children under age 644, call Graduate Pediatric Dentistry at [phone]. Children aged 24-14, please call [phone]) [phone] to request a pediatric application.  Dental services are provided in all areas of dental care including fillings, crowns and bridges, complete and partial dentures, implants, gum  treatment, root canals, and extractions. Preventive care is also provided. Treatment is provided to both adults and children. Patients are selected via a lottery and there is often a waiting list.   [facility] [address], [place]  [phone] [email or web address]   [facility] [address], [place], [place] [phone], Ext. [phone] Second and Fourth Thursday of each month, opens at 6:30 AM; Clinic ends at 9 AM.  Patients are seen on a first-come first-served basis, and  a limited number are seen during each clinic.   [facility]  [address] [place], [place] [phone]   Eligibility Requirements You must have lived in [place], [place], or [place] counties for at least the last three months.   You cannot be eligible for state or federal sponsored [place], including [employer], [place], or [employer].   You generally cannot be eligible for healthcare insurance through your employer.    How to apply: Eligibility screenings are held every Tuesday and Wednesday afternoon from 1:00 pm until 4:00 pm. You do not need an appointment for the interview!  [facility] [address] [place], [place] [phone]   [facility] Health Department  [phone]   [facility] Health Department  [phone]   [facility] Health Department  [phone]    Behavioral Health Resources in the Community: Intensive Outpatient Programs Organization         Address  Phone  Notes  [facility] [address], [place], [place] [phone]   [facility] Outpatient [address] [place], [place] [phone]   [facility] [address], [place], [place]  [phone]   [facility] Mental Health [address]  [place], [place] [phone] or [phone]   Substance Abuse Resources Organization         Address  Phone  Notes  Alcohol and  Drug Services  [phone]   [facility]  [phone]   [facility] [place]  [phone]   Floydene Flock  [phone]   Residential & Outpatient Substance Abuse Program  [phone]   Psychological Services Organization         Address  Phone  Notes  [facility] Behavioral Health  [phone]   [facility] Services  [phone]   [facility] Mental Health [address] [place] [phone] or [phone]    Mobile Crisis Teams Organization         Address  Phone  Notes  [facility], Mobile Crisis Care Unit  [phone]   [facility]  [address] [place], [place] [phone]   Doristine Locks [address] [place] [phone]    Self-Help/Support Groups Organization         Address  Phone             Notes  [facility] [place] - variety of support groups  [phone]- I7437963 Call for more information  Narcotics Anonymous (NA), [facility] [address], [employer] [place]  2 meetings at this location   Statistician         Address  Phone  Notes  [facility] [address],    [place]  [phone]   [facility]  [address] [place] [address], [place], [place] [phone]   [facility] [address] [place], [place] [phone] Admissions: 8am-3pm M-F  [facility] [address]    [place], [place] [phone]   [facility] [address] [place], [place] [phone]   [facility] [address],  [place], [place] [phone]   [facility] - Intensive Outpatient [address]ure[address], [place], [place] [phone]   [facility] ([facility] [address]  [place], [place] [phone] or [phone]   [facility] ([facility]) [address], [place], [place] [phone] Accepts Medicaid  [facility]  [address],  [place] [phone] Substance Abuse/Addiction Treatment   [facility] Organization         Address  Phone  Notes  [facility]  [phone]   Domenic Schwab, PhD [address] [place], [place]   [phone] or [phone]   [place], [place] [phone]   [address], [place], [place] [phone] Insurance/Medicaid/sponsorship through [facility] and Families [address]                                    [place], [place] [phone] [place] [address], [place] [phone]    Dr. Adele Schilder  [phone]   [facility] Dept. 1) [address], [place] 2) [place] 3)  [place] [address], [place] [phone]  [phone]   [place] [phone] or [phone] (After Hours)

## 2015-02-02 NOTE — ED Provider Notes (Signed)
CSN: [id number]     Arrival date & time 02/02/15  1156 History   First MD Initiated Contact with Patient 02/02/15 1234     Chief Complaint  Patient presents with  . Abdominal Pain     (Consider location/radiation/quality/duration/timing/severity/associated sxs/prior Treatment) The history is provided by the patient and medical records. No language interpreter was used.     Adam Christian is a 34 y.o. male  with a hx of depression,  presents to the Emergency Department complaining of gradual, persistent, progressively worsening central abdominal pain onset 8pm last night.  Pt reports the pain is 5/10, dull, throbbing without radiation.  Pt reports last BM was yesterday.  No diarrhea.  Pt reports stool was hard.  No aggravating or alleviating factors.  Nothing makes it better or worse.  Pt denies fever, chills, CP, SOB, vomiting, diarrhea, weakness, dizziness.  Pt denies international travel.  Pt reports he as 2 kids in the house but no one with vomiting and diarrhea.      Past Medical History  Diagnosis Date  . Depression   . Cyst 34 years old was first occurance    Pt has multiple cysts: L axila, groing, gluteous, scrotum  . Hydradenitis    Past Surgical History  Procedure Laterality Date  . Kidney donation    . Tonsillectomy    . Tonsillectomy    . Boil    . Kidney surgery  March 29, 2003    patient gave mother one of his kidneys   Family History  Problem Relation Age of Onset  . Kidney failure Mother   . Hypertension Mother   . Hypertension Sister    Social History  Substance Use Topics  . Smoking status: Current Every Day Smoker  . Smokeless tobacco: Never Used  . Alcohol Use: Yes     Comment: occasionally    Review of Systems  Constitutional: Negative for fever, diaphoresis, appetite change, fatigue and unexpected weight change.  HENT: Negative for mouth sores and trouble swallowing.   Respiratory: Negative for cough, chest tightness, shortness of breath,  wheezing and stridor.   Cardiovascular: Negative for chest pain and palpitations.  Gastrointestinal: Positive for nausea and abdominal pain. Negative for vomiting, diarrhea, constipation, blood in stool, abdominal distention and rectal pain.  Genitourinary: Negative for dysuria, urgency, frequency, hematuria, flank pain and difficulty urinating.  Musculoskeletal: Negative for back pain, neck pain and neck stiffness.  Skin: Negative for rash.  Neurological: Negative for weakness.  Hematological: Negative for adenopathy.  Psychiatric/Behavioral: Negative for confusion.  All other systems reviewed and are negative.     Allergies  Iodine and Shrimp  Home Medications   Prior to Admission medications   Medication Sig Start Date End Date Taking? Authorizing Provider  citalopram (CELEXA) 20 MG tablet Take 20 mg by mouth daily.   Yes Historical Provider, MD  dicyclomine (BENTYL) 20 MG tablet Take 1 tablet (20 mg total) by mouth 2 (two) times daily. 02/02/15   Sasuke Yaffe, PA-C  ondansetron (ZOFRAN ODT) 8 MG disintegrating tablet 8mg  ODT q4 hours prn nausea 02/02/15   Neno Hohensee, PA-C   BP 137/110 mmHg  Pulse 58  Temp(Src) 98.2 F (36.8 C) (Oral)  Resp 16  SpO2 100% Physical Exam  Constitutional: He appears well-developed and well-nourished. No distress.  Awake, alert, nontoxic appearance  HENT:  Head: Normocephalic and atraumatic.  Mouth/Throat: Oropharynx is clear and moist. No oropharyngeal exudate.  Eyes: Conjunctivae are normal. No scleral icterus.  Neck: Normal range of  motion. Neck supple.  Cardiovascular: Normal rate, regular rhythm, normal heart sounds and intact distal pulses.   No murmur heard. Pulmonary/Chest: Effort normal and breath sounds normal. No respiratory distress. He has no wheezes.  Equal chest expansion  Abdominal: Soft. Bowel sounds are normal. He exhibits no distension and no mass. There is no tenderness. There is no rebound and no guarding.   Musculoskeletal: Normal range of motion. He exhibits no edema.  Neurological: He is alert.  Speech is clear and goal oriented Moves extremities without ataxia  Skin: Skin is warm and dry. He is not diaphoretic.  Psychiatric: He has a normal mood and affect.  Nursing note and vitals reviewed.   ED Course  Procedures (including critical care time) Labs Review Labs Reviewed  COMPREHENSIVE METABOLIC PANEL - Abnormal; Notable for the following:    Glucose, Bld 109 (*)    Calcium 8.8 (*)    All other components within normal limits  CBC - Abnormal; Notable for the following:    Hemoglobin 11.9 (*)    HCT 37.9 (*)    MCH 25.7 (*)    All other components within normal limits  LIPASE, BLOOD  URINALYSIS, ROUTINE W REFLEX MICROSCOPIC (NOT AT [facility])    Imaging Review Dg Abd Acute W/chest  02/02/2015  CLINICAL DATA:  Abdominal pain for 1 day EXAM: DG ABDOMEN ACUTE W/ 1V CHEST COMPARISON:  None. FINDINGS: There is no evidence of dilated bowel loops or free intraperitoneal air. No radiopaque calculi or other significant radiographic abnormality is seen. Heart size and mediastinal contours are within normal limits. Both lungs are clear. IMPRESSION: No acute abnormality noted. Electronically Signed   By: Alcide Clever M.D.   On: 02/02/2015 14:26   I have personally reviewed and evaluated these images and lab results as part of my medical decision-making.   EKG Interpretation None      MDM   Final diagnoses:  Generalized abdominal pain  Essential hypertension   Adam Christian presents with generalized abd pain.  Patient is nontoxic, nonseptic appearing, in no apparent distress.  Patient is eating in the room. Patient's pain and other symptoms adequately managed in emergency department.  Labs, imaging and vitals reviewed.  Patient does not meet the SIRS or Sepsis criteria.  On repeat exam patient does not have a surgical abdomin and there are no peritoneal signs.  No indication of  appendicitis, bowel obstruction, bowel perforation, cholecystitis, diverticulitis.  Patient discharged home with symptomatic treatment and given strict instructions for follow-up with their primary care physician.  I have also discussed reasons to return immediately to the ER.  Patient expresses understanding and agrees with plan.  BP 137/110 mmHg  Pulse 58  Temp(Src) 98.2 F (36.8 C) (Oral)  Resp 16  SpO2 100%      Dierdre Forth, PA-C 02/02/15 1459  Gerhard Munch, MD 02/02/15 1550

## 2015-02-02 NOTE — ED Notes (Signed)
Patient aware that a urine sample is needed. Urinal is at the bedside.  

## 2015-06-20 ENCOUNTER — Encounter (HOSPITAL_COMMUNITY): Payer: Self-pay | Admitting: Emergency Medicine

## 2015-06-20 ENCOUNTER — Emergency Department (HOSPITAL_COMMUNITY)
Admission: EM | Admit: 2015-06-20 | Discharge: 2015-06-20 | Disposition: A | Discharge status: Home / Self Care | Payer: BLUE CROSS/BLUE SHIELD | Attending: Emergency Medicine | Admitting: Emergency Medicine

## 2015-06-20 DIAGNOSIS — Z79899 Other long term (current) drug therapy: Secondary | ICD-10-CM | POA: Diagnosis not present

## 2015-06-20 DIAGNOSIS — F329 Major depressive disorder, single episode, unspecified: Secondary | ICD-10-CM | POA: Diagnosis not present

## 2015-06-20 DIAGNOSIS — L02412 Cutaneous abscess of left axilla: Secondary | ICD-10-CM | POA: Diagnosis present

## 2015-06-20 DIAGNOSIS — L732 Hidradenitis suppurativa: Secondary | ICD-10-CM | POA: Insufficient documentation

## 2015-06-20 DIAGNOSIS — F17200 Nicotine dependence, unspecified, uncomplicated: Secondary | ICD-10-CM | POA: Diagnosis not present

## 2015-06-20 MED ORDER — DOXYCYCLINE HYCLATE 100 MG PO CAPS: 100.0000 mg | ORAL_CAPSULE | Freq: Two times a day (BID) | ORAL | Status: DC

## 2015-06-20 NOTE — Discharge Instructions (Signed)

## 2015-06-20 NOTE — ED Notes (Signed)
Pt states hx of hydradenitis.  Has had surgeries for same. C/o abscesses to under lt arm and "in the gooch area".

## 2015-06-20 NOTE — ED Provider Notes (Signed)
History  By signing my name below, I, Karle Plumber, attest that this documentation has been prepared under the direction and in the presence of Roxy Horseman, PA-C. Electronically Signed: Karle Plumber, ED Scribe. 06/20/2015. 5:33 PM.  Chief Complaint  Patient presents with  . Abscess   The history is provided by the patient and medical records. No language interpreter was used.    HPI Comments:  Adam Christian is a 35 y.o. male with PMHx of hydradenitis suppurativa who presents to the Emergency Department complaining of an abscess to the left axilla that appeared a couple days ago. He also reports some pain in the perineum. He reports associated moderate pain, swelling and mild drainage to the abscess of the axilla. He has been soaking in a hot bath for treatment. Touching the areas increase the pain. He denies alleviating factors. He denies fever, chills, nausea, vomiting. He denies allergies to any medications.  Past Medical History  Diagnosis Date  . Depression   . Cyst 35 years old was first occurance    Pt has multiple cysts: L axila, groing, gluteous, scrotum  . Hydradenitis    Past Surgical History  Procedure Laterality Date  . Kidney donation    . Tonsillectomy    . Tonsillectomy    . Boil    . Kidney surgery  March 29, 2003    patient gave mother one of his kidneys   Family History  Problem Relation Age of Onset  . Kidney failure Mother   . Hypertension Mother   . Hypertension Sister    Social History  Substance Use Topics  . Smoking status: Current Every Day Smoker  . Smokeless tobacco: Never Used  . Alcohol Use: Yes     Comment: occasionally    Review of Systems  Constitutional: Negative for fever and chills.  Gastrointestinal: Negative for nausea and vomiting.  Skin: Positive for color change.    Allergies  Iodine and Shrimp  Home Medications   Prior to Admission medications   Medication Sig Start Date End Date Taking? Authorizing  Provider  citalopram (CELEXA) 20 MG tablet Take 20 mg by mouth daily.    Historical Provider, MD  dicyclomine (BENTYL) 20 MG tablet Take 1 tablet (20 mg total) by mouth 2 (two) times daily. 02/02/15   Hannah Muthersbaugh, PA-C  ondansetron (ZOFRAN ODT) 8 MG disintegrating tablet  ODT q4 hours prn nausea 02/02/15   Dahlia Client Muthersbaugh, PA-C   Triage Vitals: BP 136/86 mmHg  Pulse 55  Temp(Src) 98 F (36.7 C) (Oral)  Resp 16  SpO2 100% Physical Exam  Constitutional: He is oriented to person, place, and time. He appears well-developed and well-nourished.  HENT:  Head: Normocephalic and atraumatic.  Eyes: EOM are normal.  Neck: Normal range of motion.  Cardiovascular: Normal rate.   Pulmonary/Chest: Effort normal.  Musculoskeletal: Normal range of motion.  Neurological: He is alert and oriented to person, place, and time.  Skin: Skin is warm and dry.  Left axilla, multiple prior scars from previous incision, mild tenderness, no fluctuance  perineum is non-erythematous, no evidence of abscess, no involvement of scrotum, no evidence of perirectal inflammation or abscess  Psychiatric: He has a normal mood and affect. His behavior is normal.  Nursing note and vitals reviewed.   ED Course  Procedures (including critical care time) DIAGNOSTIC STUDIES: Oxygen Saturation is 100% on RA, normal by my interpretation.   COORDINATION OF CARE: 5:29 PM- Will prescribe Doxycycline and give referral to a surgeon. Return precautions  discussed. Pt verbalizes understanding and agrees to plan.   MDM   Final diagnoses:  Hydradenitis    Patient with long history of hidradenitis. States that he has had some swelling and pain in his left axilla and perineum. States that this is not new. He states that if he catches the swelling early, he is able to take antibiotics and have his symptoms resolve. He declines incision and drainage today, and states that he like to try just antibiotics. I will also  advise him to continue warm compresses. Patient also states that he just got health insurance, therefore I will give her a referral to surgery. Will treat with doxycycline. I have given the patient strict return precautions to come back if his symptoms worsen, if he runs fever, or for any purulent discharge.  I personally performed the services described in this documentation, which was scribed in my presence. The recorded information has been reviewed and is accurate.       Roxy Horsemanobert Kawika Bischoff, PA-C 06/20/15 1739  Pricilla LovelessScott Goldston, MD 06/21/15 [phone]

## 2015-10-14 ENCOUNTER — Encounter (HOSPITAL_COMMUNITY): Payer: Self-pay

## 2015-10-14 ENCOUNTER — Emergency Department (HOSPITAL_COMMUNITY)
Admission: EM | Admit: 2015-10-14 | Discharge: 2015-10-14 | Disposition: A | Discharge status: Home / Self Care | Payer: BLUE CROSS/BLUE SHIELD | Attending: Emergency Medicine | Admitting: Emergency Medicine

## 2015-10-14 DIAGNOSIS — W57XXXA Bitten or stung by nonvenomous insect and other nonvenomous arthropods, initial encounter: Secondary | ICD-10-CM | POA: Insufficient documentation

## 2015-10-14 DIAGNOSIS — F17200 Nicotine dependence, unspecified, uncomplicated: Secondary | ICD-10-CM | POA: Diagnosis not present

## 2015-10-14 DIAGNOSIS — Y999 Unspecified external cause status: Secondary | ICD-10-CM | POA: Diagnosis not present

## 2015-10-14 DIAGNOSIS — S1086XA Insect bite of other specified part of neck, initial encounter: Secondary | ICD-10-CM | POA: Diagnosis present

## 2015-10-14 DIAGNOSIS — Y92009 Unspecified place in unspecified non-institutional (private) residence as the place of occurrence of the external cause: Secondary | ICD-10-CM | POA: Insufficient documentation

## 2015-10-14 DIAGNOSIS — Y939 Activity, unspecified: Secondary | ICD-10-CM | POA: Insufficient documentation

## 2015-10-14 MED ORDER — DOXYCYCLINE HYCLATE 100 MG PO CAPS: 100.0000 mg | ORAL_CAPSULE | Freq: Two times a day (BID) | ORAL | 0 refills | Status: DC

## 2015-10-14 MED ORDER — DEXAMETHASONE SODIUM PHOSPHATE 10 MG/ML IJ SOLN
10.0000 mg | Freq: Once | INTRAMUSCULAR | Status: AC
  Administered 2015-10-14: 10 mg via INTRAMUSCULAR
  Filled 2015-10-14: qty 1

## 2015-10-14 NOTE — ED Notes (Signed)
Declined W/C at D/C and was escorted to lobby by RN. 

## 2015-10-14 NOTE — ED Provider Notes (Signed)
[facility]-EMERGENCY DEPT Provider Note   CSN: [id number] Arrival date & time: 10/14/15  1023 By signing my name below, I, Adam Christian, attest that this documentation has been prepared under the direction and in the presence of Adam Christian, [place]. Electronically Signed: Bridgette Christian, ED Scribe. 10/14/15. 10:49 AM.  History   Chief Complaint Chief Complaint  Patient presents with  . Insect Bite   HPI Comments: Adam Christian is a 35 y.o. male who presents to the Emergency Department complaining of sudden onset, constant, 7/10, raised area to left neck with pain onset this morning. Pt states he woke up with his symptoms. He states that he thinks he was stung by a bee, he notes that they have a bee infestation at their house. He did not witness the sting. Pt denies any itching to the area. Per pt, he tried to express area PTA and notes there was few drops of mild, purulent drainage present. Pt states he has h/o abscesses and reports this is not similar. Pt denies fever. He has no other complaints at this time.   The history is provided by the patient. No language interpreter was used.    Past Medical History:  Diagnosis Date  . Cyst 35 years old was first occurance   Pt has multiple cysts: L axila, groing, gluteous, scrotum  . Depression   . Hydradenitis     Patient Active Problem List   Diagnosis Date Noted  . Hydradenitis 05/05/2013  . Depression, major (HCC) 12/24/2010  . DVT 03/10/2007    Past Surgical History:  Procedure Laterality Date  . boil    . KIDNEY DONATION    . KIDNEY SURGERY  March 29, 2003   patient gave mother one of his kidneys  . TONSILLECTOMY    . TONSILLECTOMY         Home Medications    Prior to Admission medications   Medication Sig Start Date End Date Taking? Authorizing Provider  citalopram (CELEXA) 20 MG tablet Take 20 mg by mouth daily.    Historical Provider, MD  dicyclomine (BENTYL) 20 MG tablet Take 1 tablet (20 mg total) by mouth 2 (two) times  daily. 02/02/15   Hannah Muthersbaugh, PA-C  doxycycline (VIBRAMYCIN) 100 MG capsule Take 1 capsule (100 mg total) by mouth 2 (two) times daily. 06/20/15   Roxy Horseman, PA-C  ondansetron (ZOFRAN ODT) 8 MG disintegrating tablet 8mg  ODT q4 hours prn nausea 02/02/15   Dierdre Forth, PA-C    Family History Family History  Problem Relation Age of Onset  . Kidney failure Mother   . Hypertension Mother   . Hypertension Sister     Social History Social History  Substance Use Topics  . Smoking status: Current Every Day Smoker  . Smokeless tobacco: Never Used  . Alcohol use Yes     Comment: occasionally     Allergies   Iodine and Shrimp [shellfish allergy]   Review of Systems Review of Systems 10 Systems reviewed and all are negative for acute change except as noted in the HPI. Physical Exam Updated Vital Signs BP 126/90 (BP Location: Right Arm)   Pulse 64   Temp 98.1 F (36.7 C) (Oral)   Resp 18   SpO2 100%   Physical Exam  Constitutional: He appears well-developed and well-nourished.  HENT:  Head: Normocephalic.  Eyes: Conjunctivae are normal.  Cardiovascular: Normal rate.   Pulmonary/Chest: Effort normal. No respiratory distress.  Abdominal: He exhibits no distension.  Musculoskeletal: Normal range of motion.  Neurological: He is alert.  Skin: Skin is warm and dry.  Left lateral neck with 1.5 cm raised, non-fluctuant, mildly tender lesion with small pustular head. Scant drop of purulence is expressible with pressure, no palpable or visible stinger. No surrounding erythema of skin. No other rashes or lesions.   Psychiatric: He has a normal mood and affect. His behavior is normal.  Nursing note and vitals reviewed.    ED Treatments / Results  DIAGNOSTIC STUDIES: Oxygen Saturation is 100% on RA, normal by my interpretation.    COORDINATION OF CARE: 10:45 AM Discussed treatment plan with pt at bedside which includes steroid shot and antibiotic Rx and pt  agreed to plan.  Labs (all labs ordered are listed, but only abnormal results are displayed) Labs Reviewed - No data to display  EKG  EKG Interpretation None       Radiology No results found.  Procedures Procedures (including critical care time)  Medications Ordered in ED Medications - No data to display   Initial Impression / Assessment and Plan / ED Course  I have reviewed the triage vital signs and the nursing notes.  Pertinent labs & imaging results that were available during my care of the patient were reviewed by me and considered in my medical decision making (see chart for details).  Clinical Course   No evidence of abscess at this time. Possible bee sting; pt has known infestation at his house and states he has a high pain tolerance so it does not surprise him he did not wake from sleep when stung. Given history of recurrent abscesses and hidradenitis will cover with course of doxy. Shot of decadron given in the ED as well. No evidence of abscess requiring drainage at this time. Hemodynamically stable. The patient appears reasonably screened and/or stabilized for discharge and I doubt any other medical condition or other [facility] requiring further screening, evaluation, or treatment in the ED at this time prior to discharge.   Final Clinical Impressions(s) / ED Diagnoses   Final diagnoses:  Insect bite    New Prescriptions New Prescriptions   DOXYCYCLINE (VIBRAMYCIN) 100 MG CAPSULE    Take 1 capsule (100 mg total) by mouth 2 (two) times daily.   I personally performed the services described in this documentation, which was scribed in my presence. The recorded information has been reviewed and is accurate.   Carlene CoriaSerena Y Rosebud Koenen, PA-C 10/14/15 1120    Doug SouSam Jacubowitz, MD 10/14/15 [phone]

## 2015-10-14 NOTE — ED Triage Notes (Signed)
Patient awoke this am with raised area to left neck, states I was stung by bee. States that he has regular abscesses but this is different, tried to express area with minor drainage prior to arrival

## 2015-10-14 NOTE — Discharge Instructions (Signed)
Take antibiotics as prescribed. Take ibuprofen as needed for pain. Return to the ER for new or worsening symptoms such as large abscess, increased size of the area, redness, fever.

## 2016-05-24 ENCOUNTER — Encounter (HOSPITAL_COMMUNITY): Payer: Self-pay | Admitting: *Deleted

## 2016-05-24 ENCOUNTER — Emergency Department (HOSPITAL_COMMUNITY)
Admission: EM | Admit: 2016-05-24 | Discharge: 2016-05-24 | Disposition: A | Discharge status: Home / Self Care | Payer: BLUE CROSS/BLUE SHIELD | Attending: Emergency Medicine | Admitting: Emergency Medicine

## 2016-05-24 DIAGNOSIS — F17200 Nicotine dependence, unspecified, uncomplicated: Secondary | ICD-10-CM | POA: Insufficient documentation

## 2016-05-24 DIAGNOSIS — R1030 Lower abdominal pain, unspecified: Secondary | ICD-10-CM | POA: Diagnosis present

## 2016-05-24 DIAGNOSIS — Z79899 Other long term (current) drug therapy: Secondary | ICD-10-CM | POA: Insufficient documentation

## 2016-05-24 DIAGNOSIS — L732 Hidradenitis suppurativa: Secondary | ICD-10-CM | POA: Insufficient documentation

## 2016-05-24 MED ORDER — DOXYCYCLINE HYCLATE 100 MG PO CAPS: 100.0000 mg | ORAL_CAPSULE | Freq: Two times a day (BID) | ORAL | 0 refills | Status: DC

## 2016-05-24 NOTE — ED Triage Notes (Signed)
Pt states hx of hydradenitis.  States 2 new abscesses, one in testicle that "tunnels".

## 2016-05-24 NOTE — ED Provider Notes (Signed)
[facility] Provider Note   CSN: [id number] Arrival date & time: 05/24/16  1003     History   Chief Complaint Chief Complaint  Patient presents with  . Abscess    HPI Adam Christian is a 36 y.o. male.  HPI   36 year old male with hx of hydradenitis suppurativa presenting for evaluation of skin boils.  Patient has had recurrent hidradenitis affecting his axillary region and groin region for many years. For the past week he has noticed increase drainage and pain to his groin region at the site of prior hidradenitis. He has tried using sitz bath, and over-the-counter medication for his condition. He states that pain is tolerable. he is here requesting for antibiotic.  He denies having fever, chills, lightheadedness, dizziness, abdominal pain, back pain, dysuria, hematuria. He is up-to-date with tetanus. Patient also requesting for work note. No other complaint. He has been seen several times in the past with same condition.  Past Medical History:  Diagnosis Date  . Cyst 36 years old was first occurance   Pt has multiple cysts: L axila, groing, gluteous, scrotum  . Depression   . Hydradenitis     Patient Active Problem List   Diagnosis Date Noted  . Hydradenitis 05/05/2013  . Depression, major 12/24/2010  . DVT 03/10/2007    Past Surgical History:  Procedure Laterality Date  . boil    . KIDNEY DONATION    . KIDNEY SURGERY  March 29, 2003   patient gave mother one of his kidneys  . TONSILLECTOMY    . TONSILLECTOMY         Home Medications    Prior to Admission medications   Medication Sig Start Date End Date Taking? Authorizing Provider  citalopram (CELEXA) 20 MG tablet Take 20 mg by mouth daily.    Historical Provider, MD  dicyclomine (BENTYL) 20 MG tablet Take 1 tablet (20 mg total) by mouth 2 (two) times daily. 02/02/15   Hannah Muthersbaugh, PA-C  doxycycline (VIBRAMYCIN) 100 MG capsule Take 1 capsule (100 mg total) by mouth 2 (two) times daily.  06/20/15   Roxy Horseman, PA-C  doxycycline (VIBRAMYCIN) 100 MG capsule Take 1 capsule (100 mg total) by mouth 2 (two) times daily. 10/14/15   Ace Gins Sam, PA-C  ondansetron (ZOFRAN ODT) 8 MG disintegrating tablet  ODT q4 hours prn nausea 02/02/15   Dierdre Forth, PA-C    Family History Family History  Problem Relation Age of Onset  . Kidney failure Mother   . Hypertension Mother   . Hypertension Sister     Social History Social History  Substance Use Topics  . Smoking status: Current Every Day Smoker  . Smokeless tobacco: Never Used  . Alcohol use Yes     Comment: occasionally     Allergies   Iodine and Shrimp [shellfish allergy]   Review of Systems Review of Systems  All other systems reviewed and are negative.    Physical Exam Updated Vital Signs BP (!) 148/91 (BP Location: Right Arm)   Pulse 70   Temp 97.8 F (36.6 C) (Oral)   Resp (!) 22   Ht  (1.676 m)   Wt 76.2 kg   SpO2 100%   BMI 27.12 kg/m   Physical Exam  Constitutional: He appears well-developed and well-nourished. No distress.  HENT:  Head: Atraumatic.  Eyes: Conjunctivae are normal.  Neck: Neck supple.  Abdominal: Soft. He exhibits no distension. There is no tenderness.  Genitourinary:  Genitourinary Comments: Chaperone  present during exam. Patient has multiple area of induration oozing out pustular discharge noted along both left and right inguinal region and around gluteal region. No crepitus or emphysema. Mild tenderness to palpation without any significant erythema. No penile involvement. Testicles nontender.  Neurological: He is alert.  Skin: No rash noted.  Psychiatric: He has a normal mood and affect.  Nursing note and vitals reviewed.    ED Treatments / Results  Labs (all labs ordered are listed, but only abnormal results are displayed) Labs Reviewed - No data to display  EKG  EKG Interpretation None       Radiology No results  found.  Procedures Procedures (including critical care time)  Medications Ordered in ED Medications - No data to display   Initial Impression / Assessment and Plan / ED Course  I have reviewed the triage vital signs and the nursing notes.  Pertinent labs & imaging results that were available during my care of the patient were reviewed by me and considered in my medical decision making (see chart for details).     BP (!) 148/91 (BP Location: Right Arm)   Pulse 70   Temp 97.8 F (36.6 C) (Oral)   Resp (!) 22   Ht  (1.676 m)   Wt 76.2 kg   SpO2 100%   BMI 27.12 kg/m    Final Clinical Impressions(s) / ED Diagnoses   Final diagnoses:  Suppurative hidradenitis    New Prescriptions Current Discharge Medication List     11:17 AM Patient with history of hidradenitis suppurativa here with multiple skin abscess in his groin consistence with hidradenitis.  No evidence concerning for Fournier's gangrene. Patient is well-appearing. He only request antibiotic and work note. Will provide antibiotic. He will follow up with surgeon as previously schedule. Return precaution discussed.   Fayrene Helper, PA-C 05/24/16 1124    Melene Plan, DO 05/24/16 1126

## 2017-10-02 ENCOUNTER — Other Ambulatory Visit: Payer: Self-pay

## 2017-10-02 ENCOUNTER — Encounter (HOSPITAL_COMMUNITY): Payer: Self-pay | Admitting: Emergency Medicine

## 2017-10-02 ENCOUNTER — Emergency Department (HOSPITAL_COMMUNITY)
Admission: EM | Admit: 2017-10-02 | Discharge: 2017-10-02 | Discharge status: Against Medical Advice | Payer: BLUE CROSS/BLUE SHIELD | Attending: Emergency Medicine | Admitting: Emergency Medicine

## 2017-10-02 DIAGNOSIS — R1011 Right upper quadrant pain: Secondary | ICD-10-CM | POA: Diagnosis not present

## 2017-10-02 DIAGNOSIS — R1031 Right lower quadrant pain: Secondary | ICD-10-CM | POA: Insufficient documentation

## 2017-10-02 LAB — CBC WITH DIFFERENTIAL/PLATELET
ABS IMMATURE GRANULOCYTES: 0 10*3/uL (ref 0.0–0.1)
BASOS ABS: 0.1 10*3/uL (ref 0.0–0.1)
Basophils Relative: 1 %
Eosinophils Absolute: 0.1 10*3/uL (ref 0.0–0.7)
Eosinophils Relative: 1 %
HCT: 37.6 % — ABNORMAL LOW (ref 39.0–52.0)
HEMOGLOBIN: 11.6 g/dL — AB (ref 13.0–17.0)
Immature Granulocytes: 0 %
LYMPHS PCT: 26 %
Lymphs Abs: 2.8 10*3/uL (ref 0.7–4.0)
MCH: 26 pg (ref 26.0–34.0)
MCHC: 30.9 g/dL (ref 30.0–36.0)
MCV: 84.1 fL (ref 78.0–100.0)
MONOS PCT: 6 %
Monocytes Absolute: 0.6 10*3/uL (ref 0.1–1.0)
NEUTROS ABS: 7.1 10*3/uL (ref 1.7–7.7)
Neutrophils Relative %: 66 %
Platelets: 320 10*3/uL (ref 150–400)
RBC: 4.47 MIL/uL (ref 4.22–5.81)
RDW: 13.8 % (ref 11.5–15.5)
WBC: 10.7 10*3/uL — ABNORMAL HIGH (ref 4.0–10.5)

## 2017-10-02 LAB — URINALYSIS, ROUTINE W REFLEX MICROSCOPIC
BILIRUBIN URINE: NEGATIVE
GLUCOSE, UA: NEGATIVE mg/dL
HGB URINE DIPSTICK: NEGATIVE
Ketones, ur: NEGATIVE mg/dL
Leukocytes, UA: NEGATIVE
Nitrite: NEGATIVE
PH: 6 (ref 5.0–8.0)
Protein, ur: NEGATIVE mg/dL
SPECIFIC GRAVITY, URINE: 1.01 (ref 1.005–1.030)

## 2017-10-02 LAB — COMPREHENSIVE METABOLIC PANEL
ALK PHOS: 72 U/L (ref 38–126)
ALT: 12 U/L (ref 0–44)
AST: 17 U/L (ref 15–41)
Albumin: 3.2 g/dL — ABNORMAL LOW (ref 3.5–5.0)
Anion gap: 7 (ref 5–15)
BUN: 11 mg/dL (ref 6–20)
CALCIUM: 8.2 mg/dL — AB (ref 8.9–10.3)
CO2: 25 mmol/L (ref 22–32)
CREATININE: 1.14 mg/dL (ref 0.61–1.24)
Chloride: 104 mmol/L (ref 98–111)
GFR calc Af Amer: >60 mL/min (ref >60)
GFR calc non Af Amer: >60 mL/min (ref >60)
Glucose, Bld: 129 mg/dL — ABNORMAL HIGH (ref 70–99)
Potassium: 3.8 mmol/L (ref 3.5–5.1)
SODIUM: 136 mmol/L (ref 135–145)
Total Bilirubin: 0.6 mg/dL (ref 0.3–1.2)
Total Protein: 7.3 g/dL (ref 6.5–8.1)

## 2017-10-02 LAB — LIPASE, BLOOD: Lipase: 30 U/L (ref 11–51)

## 2017-10-02 NOTE — ED Triage Notes (Signed)
Pt c/o abd pain right lower quad- started Sunday, after work, denies diarrhea/vomiting/denies any urianry symptoms,  Last BM today- nml and formed.

## 2017-10-02 NOTE — ED Notes (Signed)
Called 1x without answer. RN notified.

## 2017-10-02 NOTE — ED Notes (Signed)
There have 2 attempts at calling pt with no response

## 2017-10-02 NOTE — ED Notes (Signed)
Pt departed without informing staff of intentions.

## 2017-10-02 NOTE — ED Provider Notes (Signed)
Patient placed in Quick Look pathway, seen and evaluated   Chief Complaint: abd pain  HPI:   Patient presenting for evaluation of right lower quadrant abdominal pain which began 4 to 5 days ago.  He was at home doing nothing when the pain pain began.  It is always in the right lower quadrant, does not move elsewhere.  He reports a constant throbbing with occasional shooting pains.  Nothing makes it better or worse.  It is not worse with movement.  It is not associated with oral intake, urination, or bowel movements.  He denies associated fevers, chills, chest pain, shortness breath, nausea, vomiting, urinary symptoms, abnormal bowel movements.  He denies history of similar pain.  ROS: Right lower quadrant pain  Physical Exam:   Gen: No distress  Neuro: Awake and Alert  Skin: Warm  Abdomen: Tenderness palpation of the right lower and right upper abdomen without rigidity, guarding or distention.  Negative rebound.  No flank pain.   Initiation of care has begun. The patient has been counseled on the process, plan, and necessity for staying for the completion/evaluation, and the remainder of the medical screening examination    Alveria ApleyCaccavale, Lydiann Bonifas, PA-C 10/02/17 1655    Mesner, Barbara CowerJason, MD 10/11/17 0002

## 2017-10-02 NOTE — ED Notes (Signed)
Have attempted to call pt to be roomed x3.

## 2017-10-03 NOTE — ED Notes (Signed)
Follow up call made  No answer  10/03/17  1205 s Celinda Dethlefs rn

## 2018-01-23 ENCOUNTER — Emergency Department (HOSPITAL_COMMUNITY): Payer: BLUE CROSS/BLUE SHIELD

## 2018-01-23 ENCOUNTER — Other Ambulatory Visit: Payer: Self-pay

## 2018-01-23 ENCOUNTER — Encounter (HOSPITAL_COMMUNITY): Payer: Self-pay

## 2018-01-23 ENCOUNTER — Emergency Department (HOSPITAL_COMMUNITY)
Admission: EM | Admit: 2018-01-23 | Discharge: 2018-01-23 | Disposition: A | Discharge status: Home / Self Care | Payer: BLUE CROSS/BLUE SHIELD | Attending: Emergency Medicine | Admitting: Emergency Medicine

## 2018-01-23 DIAGNOSIS — L02214 Cutaneous abscess of groin: Secondary | ICD-10-CM | POA: Insufficient documentation

## 2018-01-23 DIAGNOSIS — R2242 Localized swelling, mass and lump, left lower limb: Secondary | ICD-10-CM | POA: Diagnosis present

## 2018-01-23 DIAGNOSIS — F17200 Nicotine dependence, unspecified, uncomplicated: Secondary | ICD-10-CM | POA: Insufficient documentation

## 2018-01-23 DIAGNOSIS — L0291 Cutaneous abscess, unspecified: Secondary | ICD-10-CM

## 2018-01-23 LAB — BASIC METABOLIC PANEL
Anion gap: 6 (ref 5–15)
BUN: 18 mg/dL (ref 6–20)
CO2: 26 mmol/L (ref 22–32)
CREATININE: 0.89 mg/dL (ref 0.61–1.24)
Calcium: 8.6 mg/dL — ABNORMAL LOW (ref 8.9–10.3)
Chloride: 107 mmol/L (ref 98–111)
GFR calc Af Amer: >60 mL/min (ref >60)
Glucose, Bld: 93 mg/dL (ref 70–99)
POTASSIUM: 4.6 mmol/L (ref 3.5–5.1)
SODIUM: 139 mmol/L (ref 135–145)

## 2018-01-23 LAB — CBC WITH DIFFERENTIAL/PLATELET
Abs Immature Granulocytes: 0.03 10*3/uL (ref 0.00–0.07)
BASOS PCT: 1 %
Basophils Absolute: 0.1 10*3/uL (ref 0.0–0.1)
EOS ABS: 0.2 10*3/uL (ref 0.0–0.5)
EOS PCT: 2 %
HCT: 40 % (ref 39.0–52.0)
Hemoglobin: 12.1 g/dL — ABNORMAL LOW (ref 13.0–17.0)
Immature Granulocytes: 0 %
Lymphocytes Relative: 24 %
Lymphs Abs: 2.1 10*3/uL (ref 0.7–4.0)
MCH: 26.2 pg (ref 26.0–34.0)
MCHC: 30.3 g/dL (ref 30.0–36.0)
MCV: 86.6 fL (ref 80.0–100.0)
MONO ABS: 0.9 10*3/uL (ref 0.1–1.0)
MONOS PCT: 10 %
NEUTROS ABS: 5.2 10*3/uL (ref 1.7–7.7)
Neutrophils Relative %: 63 %
PLATELETS: 300 10*3/uL (ref 150–400)
RBC: 4.62 MIL/uL (ref 4.22–5.81)
RDW: 13.9 % (ref 11.5–15.5)
WBC: 8.5 10*3/uL (ref 4.0–10.5)
nRBC: 0 % (ref 0.0–0.2)

## 2018-01-23 MED ORDER — SODIUM CHLORIDE (PF) 0.9 % IJ SOLN
INTRAMUSCULAR | Status: AC
  Filled 2018-01-23: qty 50

## 2018-01-23 MED ORDER — IOPAMIDOL (ISOVUE-300) INJECTION 61%
100.0000 mL | Freq: Once | INTRAVENOUS | Status: AC | PRN
  Administered 2018-01-23: 100 mL via INTRAVENOUS

## 2018-01-23 MED ORDER — LIDOCAINE-EPINEPHRINE (PF) 2 %-1:200000 IJ SOLN
20.0000 mL | Freq: Once | INTRAMUSCULAR | Status: AC
  Administered 2018-01-23: 10 mL via INTRADERMAL
  Filled 2018-01-23: qty 20

## 2018-01-23 MED ORDER — IOPAMIDOL (ISOVUE-300) INJECTION 61%
INTRAVENOUS | Status: AC
  Filled 2018-01-23: qty 100

## 2018-01-23 MED ORDER — DOXYCYCLINE HYCLATE 100 MG PO CAPS: 100.0000 mg | ORAL_CAPSULE | Freq: Two times a day (BID) | ORAL | 0 refills | Status: AC

## 2018-01-23 NOTE — ED Provider Notes (Signed)
Adam Christian Provider Note   CSN: [id number] Arrival date & time: 01/23/18  1013     History   Chief Complaint Chief Complaint  Patient presents with  . Abscess    HPI Adam Christian is a 37 y.o. male.  HPI   Pt is a 37 y/o male with a h/o hydradenitis who presents to the ED today c/o an abscess to the left groin area that has been present for the last 1.5 days. He reports pain and swelling to the are. Denies any drainage. No fevers or chills. He denies testicular pain, swelling or redness. No penile pain/swelling/redness. He does note that he has a lot of scar tissue to the perineal area from prior abscesses however he states he has no pain in this area currently and no drainage from any abscesses in this area. States he has seen [facility] in the past and was told he had to f/u with [facility] for surgery.  Past Medical History:  Diagnosis Date  . Cyst 37 years old was first occurance   Pt has multiple cysts: L axila, groing, gluteous, scrotum  . Depression   . Hydradenitis     Patient Active Problem List   Diagnosis Date Noted  . Hydradenitis 05/05/2013  . Depression, major 12/24/2010  . DVT 03/10/2007    Past Surgical History:  Procedure Laterality Date  . boil    . KIDNEY DONATION    . KIDNEY SURGERY  March 29, 2003   patient gave mother one of his kidneys  . TONSILLECTOMY    . TONSILLECTOMY          Home Medications    Prior to Admission medications   Medication Sig Start Date End Date Taking? Authorizing Provider  doxycycline (VIBRAMYCIN) 100 MG capsule Take 1 capsule (100 mg total) by mouth 2 (two) times daily for 7 days. 01/23/18 01/30/18  Sky Primo S, PA-C    Family History Family History  Problem Relation Age of Onset  . Kidney failure Mother   . Hypertension Mother   . Hypertension Sister     Social History Social History   Tobacco Use  . Smoking status: Current Every Day Smoker   Packs/day: 0.50  . Smokeless tobacco: Never Used  Substance Use Topics  . Alcohol use: Yes    Comment: occasionally  . Drug use: Yes    Frequency: 14.0 times per week    Types: Marijuana    Comment: occasionally     Allergies   Iodine and Shrimp [shellfish allergy]   Review of Systems Review of Systems  Constitutional: Negative for chills and fever.  Eyes: Negative for visual disturbance.  Respiratory: Negative for shortness of breath.   Cardiovascular: Negative for chest pain.  Gastrointestinal: Negative for abdominal distention, constipation, diarrhea, nausea and vomiting.  Genitourinary: Negative for flank pain.  Musculoskeletal: Negative for back pain and neck pain.  Skin:       abscess  Neurological: Negative for headaches.     Physical Exam Updated Vital Signs BP 131/82 (BP Location: Left Arm)   Pulse (!) 50   Temp 97.7 F (36.5 C) (Oral)   Resp 16   Ht 5\' 6"  (1.676 m)   Wt 83.9 kg   SpO2 100%   BMI 29.86 kg/m   Physical Exam Vitals signs and nursing note reviewed. Exam conducted with a chaperone present.  Constitutional:      General: He is not in acute distress.    Appearance:  He is well-developed.  HENT:     Head: Normocephalic and atraumatic.  Eyes:     Conjunctiva/sclera: Conjunctivae normal.  Neck:     Musculoskeletal: Neck supple.  Cardiovascular:     Rate and Rhythm: Normal rate.  Pulmonary:     Effort: Pulmonary effort is normal.  Genitourinary:    Penis: Normal.      Scrotum/Testes: Normal.     Comments: No TTP, swelling, erythema or crepitus to the penis or testes. There is scar tissue and indurated skin to the left perineal area however there is no tenderness to these areas and no fluctuance noted. Skin:    General: Skin is warm and dry.     Comments: 10-12 cm x 3 cm area of fluctuance to the left medial thigh that is TTP. No crepitus noted on exam.  Neurological:     Mental Status: He is alert and oriented to person, place, and  time.      ED Treatments / Results  Labs (all labs ordered are listed, but only abnormal results are displayed) Labs Reviewed  CBC WITH DIFFERENTIAL/PLATELET - Abnormal; Notable for the following components:      Result Value   Hemoglobin 12.1 (*)    All other components within normal limits  BASIC METABOLIC PANEL - Abnormal; Notable for the following components:   Calcium 8.6 (*)    All other components within normal limits    EKG None  Radiology Ct Femur Left W Contrast  Result Date: 01/23/2018 CLINICAL DATA:  Boil in the inner left thigh noticed 2 days ago. History of hidradenitis. EXAM: CT OF THE LOWER LEFT EXTREMITY WITH CONTRAST TECHNIQUE: Multidetector CT imaging of the left thigh was performed according to the standard protocol following intravenous contrast administration. COMPARISON:  None. CONTRAST:  ISOVUE-300 IOPAMIDOL (ISOVUE-300) INJECTION 61% FINDINGS: Bones/Joint/Cartilage Images include the entire left thigh. The left femur appears normal. There is no evidence of acute fracture, dislocation or bone destruction. No significant arthropathic changes are seen at the left hip or left knee. No significant joint effusions. Ligaments Suboptimally assessed by CT. Muscles and Tendons The left thigh muscles and tendons appear normal. Soft tissues A BB was placed over the palpable concern medially in the proximal left thigh. The thighs are closely approximated. There is skin thickening along the intergluteal cleft which extends asymmetrically into the inferior left buttocks where there is asymmetric soft tissue stranding in the subcutaneous fat. Deep to the BB, there is an ill-defined superficial fluid collection just inferior to the scrotum. This measures approximately 11.4 cm AP, 3.4 cm in length and 1.8 cm in thickness. This fluid collection is associated with mild wall enhancement and underlying subcutaneous edema, likely cellulitis. No evidence of foreign body or soft tissue  emphysema. No vascular abnormalities are seen in the left thigh. IMPRESSION: 1. Superficial fluid collection medially in the proximal left thigh suspicious for a superficial abscess. Associated dermal thickening and stranding in the underlying subcutaneous fat, consistent with cellulitis. 2. No deep abscess, articular or osseous abnormality identified. Electronically Signed   By: Carey Bullocks M.D.   On: 01/23/2018 15:12    Procedures Procedures (including critical care time)  Medications Ordered in ED Medications  iopamidol (ISOVUE-300) 61 % injection (has no administration in time range)  sodium chloride (PF) 0.9 % injection (has no administration in time range)  iopamidol (ISOVUE-300) 61 % injection 100 mL (100 mLs Intravenous Contrast Given 01/23/18 1440)  lidocaine-EPINEPHrine (XYLOCAINE W/EPI) 2 %-1:200000 (PF) injection 20  mL (10 mLs Intradermal Given by Other 01/23/18 1634)     Initial Impression / Assessment and Plan / ED Course  I have reviewed the triage vital signs and the nursing notes.  Pertinent labs & imaging results that were available during my care of the patient were reviewed by me and considered in my medical decision making (see chart for details).     Final Clinical Impressions(s) / ED Diagnoses   Final diagnoses:  Abscess   Pt presenting with large abscess to the left thigh which has been present for 1.5 days. Has h/o hydradenitis and has had multiple abscesses and scar tissue to the perineal area but no active areas of concern to suggest abscess or infection to the perineal area. No TTP or palpable fluctuance to the scrotum, testes, or penis. No clinical evidence to suggest fournier's gangrene.  VSS. No systemic signs of infection.  Will obtain basic labs and imaging of the left groin area to further assess for any deep or complex abscess that would require surgical drainage.  CBC without leukocytosis. BMP reassuring  CT femur obtained to further  evaluate the abscess which showed an ill-defined superficial fluid collection that measures 11.4 x 3.4 cm in length.  There is no evidence of soft tissue emphysema.  No deep abscess or articular or osseous abnormality.  Given the superficiality of the abscess on CT scan, bedside I&D was performed.  2 separate areas were incised and drained.  Both were probed and deloculated.  Both were packed with iodoform gauze.  Patient advised to return to the ED in 48 hours for wound recheck and packing removal. rx given for doxycycline. advised to return sooner for new or worsening sxs. Pot voices understanding of plan and reasons to return to the ed.   Pt seen in conjunction with Dr. Estell HarpinZammit who is in agreement with plan  ED Discharge Orders         Ordered    doxycycline (VIBRAMYCIN) 100 MG capsule  2 times daily     01/23/18 [id number]           Creta Dorame, [place] S, PA-C 01/23/18 1639    Bethann BerkshireZammit, Joseph, MD 01/26/18 1008

## 2018-01-23 NOTE — ED Notes (Signed)
PA at bedside.

## 2018-01-23 NOTE — ED Notes (Signed)
Bed: WTR6 Expected date:  Expected time:  Means of arrival:  Comments: 

## 2018-01-23 NOTE — ED Notes (Signed)
EDP at bedside  

## 2018-01-23 NOTE — ED Triage Notes (Signed)
Pt states that he has a "boil" on his inner left thigh that he noticed 1.5 days ago. Pt states that he has a hx of similar.

## 2018-01-23 NOTE — ED Notes (Signed)
ED Provider at bedside. 

## 2018-01-23 NOTE — Discharge Instructions (Addendum)
You were given a prescription for antibiotics. Please take the antibiotic prescription fully.   Follow up in 48 hours for wound recheck and packing removal.   Please return to the emergency room immediately if you experience any new or worsening symptoms or any symptoms that indicate worsening infection such as fevers, increased redness/swelling/pain, warmth, or drainage from the affected area.

## 2019-01-12 ENCOUNTER — Ambulatory Visit: Payer: Self-pay | Admitting: Surgery

## 2019-01-12 NOTE — H&P (Signed)
Adam Christian Documented: 01/12/2019 12:10 PM Location: [facility] Patient #: [id number] DOB: 02-03-1981 Single / Language: Adam Christian / Race: Black or African American Male  History of Present Illness Adam Moores A. Diana Davenport MD; 01/12/2019 12:35 PM) Patient words: Patient returns for follow-up after being seen here in 2015 for extensive hidradenitis involving his left axilla, perineum and groin region. He was uninsured and is now obtain medical insurance and is in need of definitive treatment of this. He has had no chemotherapy or over a year. He's had this for most of his adult wife. He was scheduled for excision back in 2015 but could not afford it. He has been stable except for the occasional urinary flare up states that's control with antibiotics. He denies any fever or chills or any drainage sinus tracts. He does have decreased range of motion of his left arm due to the scarring in his left axilla.  The patient is a 38 year old male.   Allergies Adam Christian, [place]; 01/12/2019 12:10 PM) Iodinated Diagnostic Agents Allergies Reconciled  Medication History (Michelle R. Brooks, CMA; 01/12/2019 12:10 PM) No Current Medications Medications Reconciled    Vitals (Michelle R. Brooks CMA; 01/12/2019 12:11 PM) 01/12/2019 12:10 PM Weight: 178.2 lb Height: 65in Body Surface Area: 1.88 m Body Mass Index: 29.65 kg/m  Temp.: 98.35F(Oral)  Pulse: 87 (Regular)  BP: 128/76 (Sitting, Left Arm, Standard)        Physical Exam (Adam Goostree A. Gay Rape MD; 01/12/2019 12:36 PM)  General Mental Status-Alert. General Appearance-Consistent with stated age. Hydration-Well hydrated. Voice-Normal.  Integumentary Note: Note: In the left axilla there is extensive scarring with contracture measuring roughly 25 cm x 25 cm. There is no active draining abscesses or sinus tracts. He has decreased range of motion lifting his left arm over his head. In the perineum is  extensive perineal and perianal hidradenitis that extends up to the base of the scrotum. There is no active areas of drainage, abscess or bleeding. This area measures roughly 15 x 25 x 5 cm.  Eye Eyeball - Bilateral-Extraocular movements intact. Sclera/Conjunctiva - Bilateral-No scleral icterus.  Chest and Lung Exam Note: WOB normal no wheezing  Cardiovascular Note: nsr    Assessment & Plan (Adam Dines A. 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 He will need potentially a skin flap to close this versus a split the screen graft. Refer to plastic surgery for opinion on closure specially over his axilla and perineum which will be complex and somewhat difficult Im afraid. Certainly I can assist excision especially perianal region if necessary but I think closure will be the much larger more complicated issue for him.  Current Plans Pt Education - [facility] Free Text Education/Instructions: discussed with patient and provided information. The anatomy and physiology of sweat glands and skin creases was discussed. Pathophysiology of hidradenitis suppurativa was discussed. I stressed good hygiene, avoiding razors and avoiding antiperspirants. Consideration of excision with primary closure versus need to place a drain, open packing, possible skin flaps/grafts was discussed.  Possibility of recurrence was discussed. Risks, benefits, alternatives were discussed. I noted a good likelihood this will help address the problem. Risks of anesthesia and other risks discussed. Questions answered. The patient is considering surgery. They wish to proceed.

## 2019-05-19 IMAGING — CT CT FEMUR *L* W/ CM
3 of 4 series · 11 of 33 positions shown, 12 images · IV contrast (agent unspecified)
Comparison: None.

CONTRAST:  100mL 4Y2HOQ-Y33 IOPAMIDOL (4Y2HOQ-Y33) INJECTION 61%

CLINICAL DATA: Boil in the inner left thigh noticed 2 days ago.
History of hidradenitis.

EXAM:
CT OF THE LOWER LEFT EXTREMITY WITH CONTRAST
TECHNIQUE: Multidetector CT imaging of the left thigh was performed according
to the standard protocol following intravenous contrast
administration.

[Series 4: axial st · axial · 0.50mm/px · z∈[-414,-109]mm · 3 of 330 slices shown, 4 images]
[im 76/330  soft-tissue]
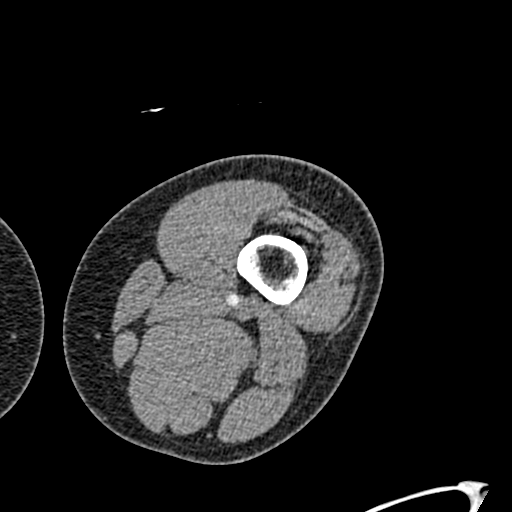
[im 76/330  bone]
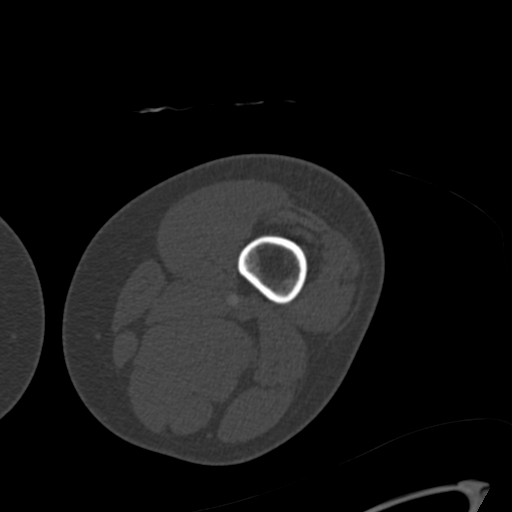
[im 178/330  bone]
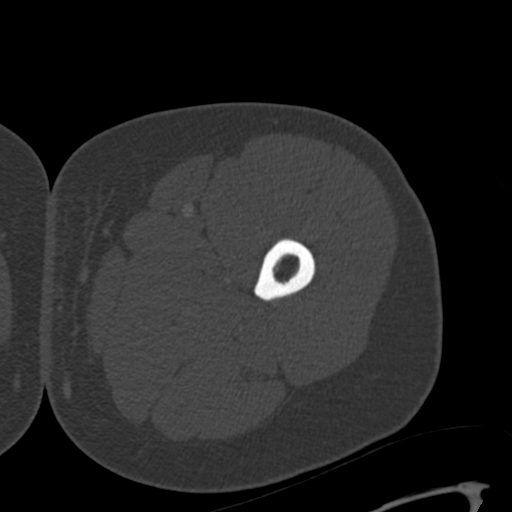
[im 279/330  bone]
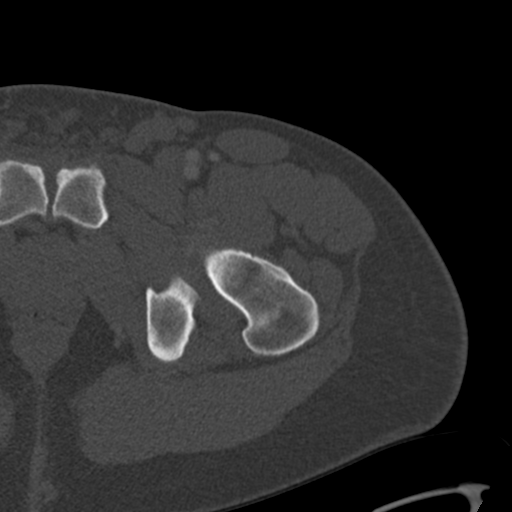

[Series 9: coronal st · coronal · 0.50mm/px · 3 of 160 slices shown]
[im 32/160  bone]
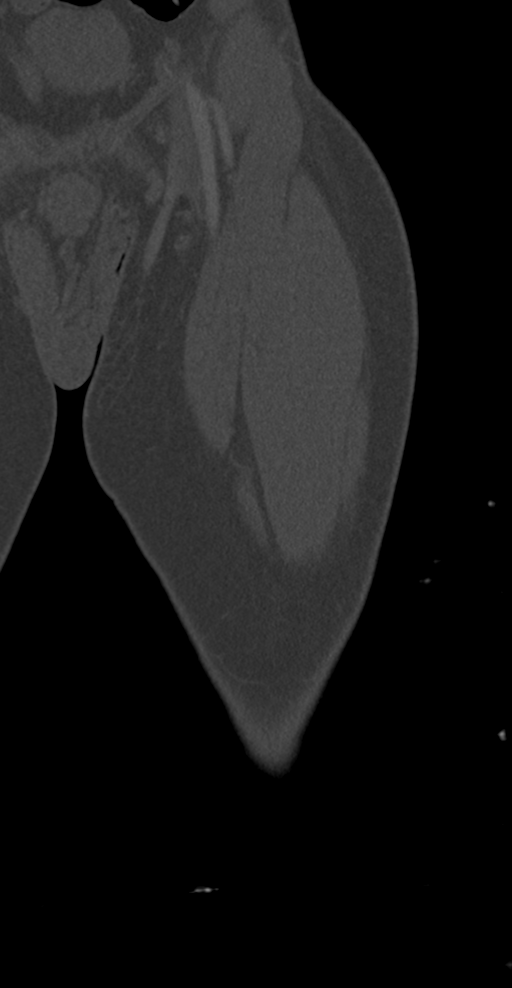
[im 64/160  bone]
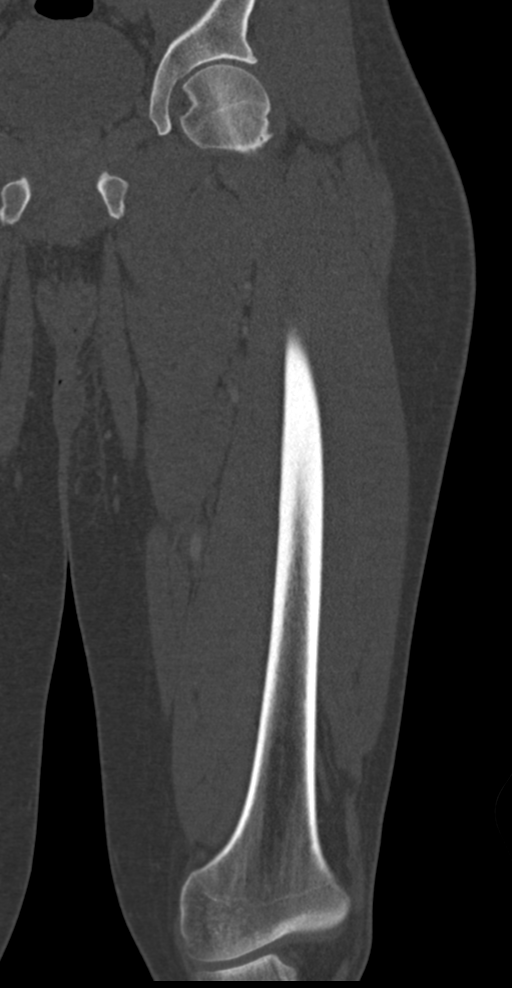
[im 96/160  bone]
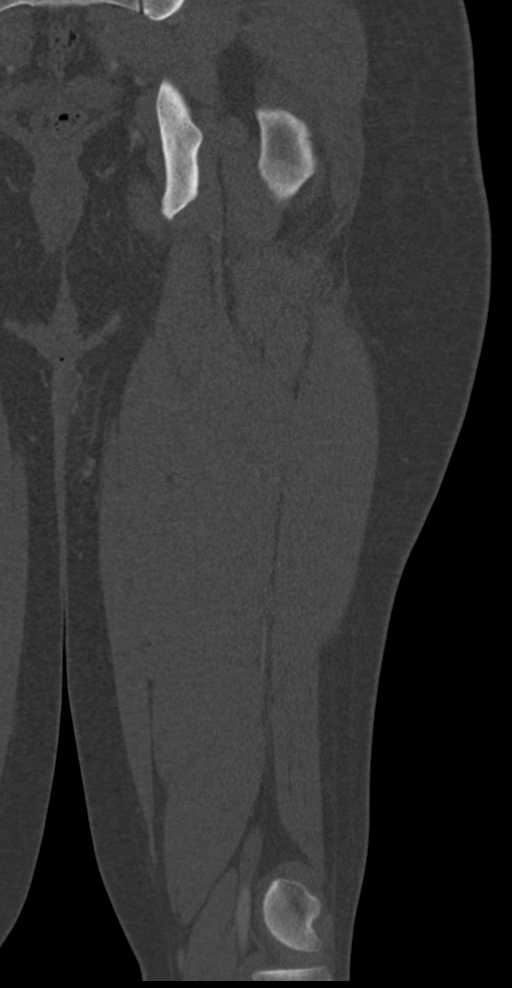

[Series 10: sagittal st · sagittal · 0.52mm/px · 5 of 165 slices shown]
[im 28/165  bone]
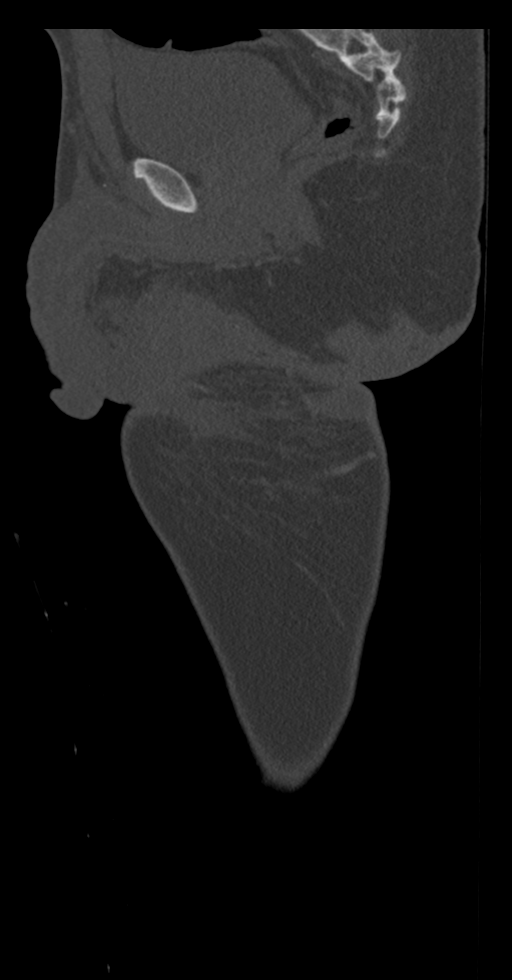
[im 55/165  bone]
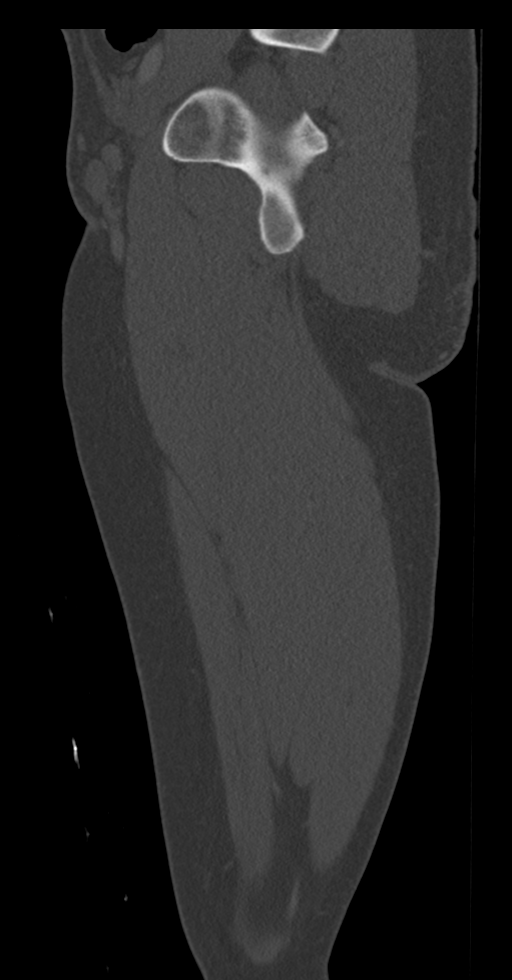
[im 83/165  bone]
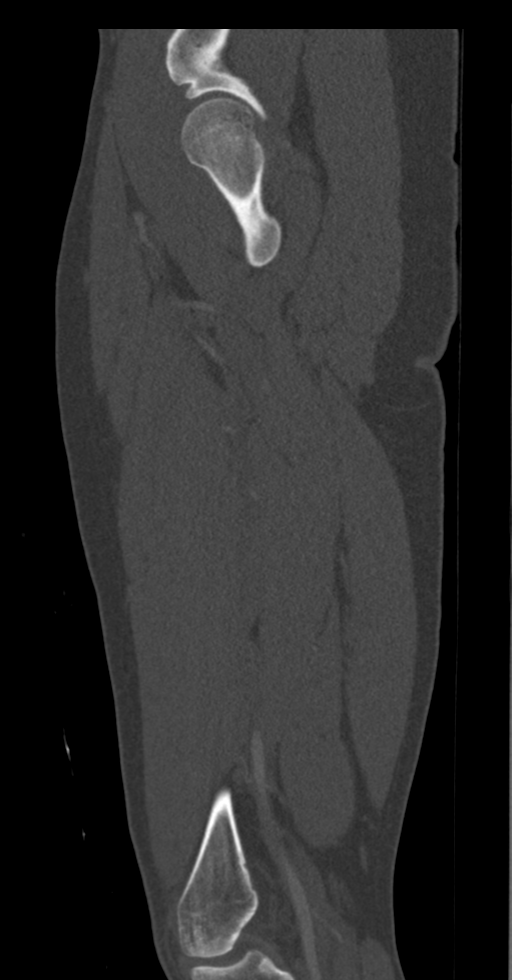
[im 110/165  bone]
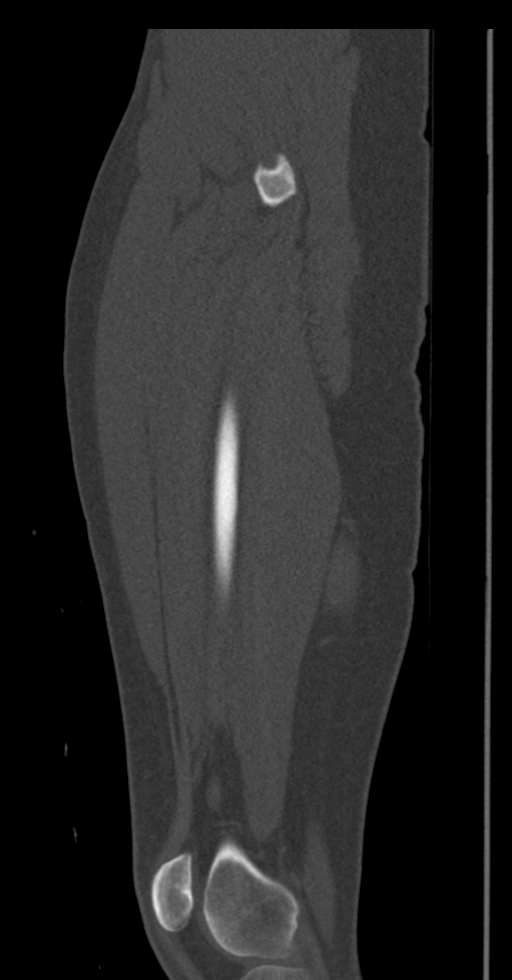
[im 137/165  bone]
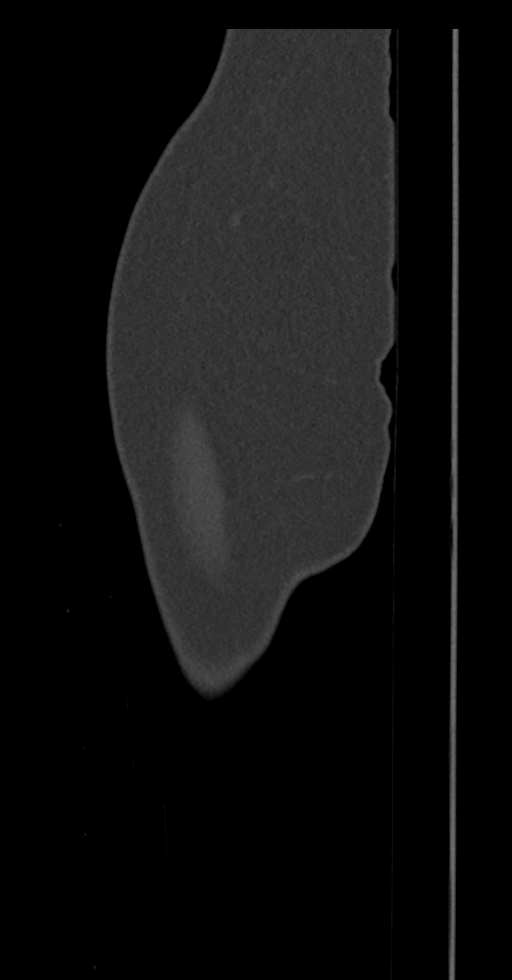

[11 of 33 positions shown; findings below may reference images not displayed]

FINDINGS: Bones/Joint/Cartilage

Images include the entire left thigh. The left femur appears normal.
There is no evidence of acute fracture, dislocation or bone
destruction. No significant arthropathic changes are seen at the
left hip or left knee. No significant joint effusions.

Ligaments

Suboptimally assessed by CT.

Muscles and Tendons

The left thigh muscles and tendons appear normal.

Soft tissues

A BB was placed over the palpable concern medially in the proximal
left thigh. The thighs are closely approximated. There is skin
thickening along the intergluteal cleft which extends asymmetrically
into the inferior left buttocks where there is asymmetric soft
tissue stranding in the subcutaneous fat. Deep to the BB, there is
an ill-defined superficial fluid collection just inferior to the
scrotum. This measures approximately 11.4 cm AP, 3.4 cm in length
and 1.8 cm in thickness. This fluid collection is associated with
mild wall enhancement and underlying subcutaneous edema, likely
cellulitis. No evidence of foreign body or soft tissue emphysema. No
vascular abnormalities are seen in the left thigh.
IMPRESSION: 1. Superficial fluid collection medially in the proximal left thigh
suspicious for a superficial abscess. Associated dermal thickening
and stranding in the underlying subcutaneous fat, consistent with
cellulitis.
2. No deep abscess, articular or osseous abnormality identified.

## 2020-03-20 ENCOUNTER — Ambulatory Visit: Payer: Self-pay | Admitting: Surgery

## 2020-03-20 NOTE — H&P (Signed)
[name] Appointment: 03/20/2020 9:00 AM Location: [facility] Patient #: [id number] DOB: Jun 26, 1980 Single / Language: [name] / Race: Black or African American Male  History of Present Illness Adam Fus A. Milad Bublitz MD; 03/20/2020 9:36 AM) Patient words: Patient returns for follow-up of his extensive chronic hidradenitis. He was referred posture to last year but given his tobacco abuse was not felt to be a suitable candidate for skin grafting or flap closure. He is done fairly well until recently we developed a severe flareup especially involving his left gluteal region into his left groin and right groin. He's been placed on antibiotics and now is her primary care doctor which is helpful to him. He continues to smoke. He returns today for follow-up. No fever or chills. Warm tub soaks of help in the antibiotics he thinks are helping as well.  The patient is a 40 year old male.    Physical Exam (Adam Wallen A. Gaelle Adriance MD; 03/20/2020 9:38 AM)  Adam Christian Note: Extensive left gluteal and left inner thigh hidradenitis noted. Right buttock looks normal today. His right groin also has extensive hidradenitis extending onto which a saphenous scrotum. Sinus tracts or draining injury does not appear to be any undrained fluid collection considered. He is tender in both areas states he's feeling better since antibiotics were started.   Chest and Lung Exam Chest and lung exam reveals -quiet, even and easy respiratory effort with no use of accessory muscles and on auscultation, normal breath sounds, no adventitious sounds and normal vocal resonance. Inspection Chest Wall - Normal. Back - normal.  Cardiovascular Cardiovascular examination reveals -on palpation PMI is normal in location and amplitude, no palpable S3 or S4. Normal cardiac borders., normal heart sounds, regular rate and rhythm with no murmurs, carotid auscultation reveals no bruits and normal pedal pulses  bilaterally.  Neurologic Neurologic evaluation reveals -alert and oriented x 3 with no impairment of recent or remote memory. Mental Status-Normal.    Assessment & Plan (Adam Riebe A. Yuli Lanigan MD; 03/20/2020 9:39 AM)  HIDRADENITIS (L73.2) Impression: left buttock and right groin  axilla stable currently still smoking   Explained that soft tissue coverage would not be possible endovascular repair would be to irrigate and debride the area the best we could and pocket for symptomatic relief. This will not unfortunately clearly and as long as he keeps smoking is no reason to try to do that which I explained to him. Since this is been a fairly significant flareup he is in agreement for debridement of his left buttock and right groin and packing with wound care. Unfortunately until he stopped smoking no be no way to cover these areas with a skin graft for flap is aware of that. I strongly encouraged him to stop smoking since this makes the disease much worse in anyway. Risk of bleeding, infection, further debridement necessary, wound packing and wound care issues, possible significant scarring and contracture. Risk of blood vessel injury nerve injury and chronic pain discussed especially if he keeps smoking.  Current Plans Pt Education - [facility] Free Text Education/Instructions: discussed with patient and provided information.

## 2020-05-05 ENCOUNTER — Other Ambulatory Visit (HOSPITAL_COMMUNITY)
Admission: RE | Admit: 2020-05-05 | Discharge: 2020-05-05 | Disposition: A | Discharge status: Home / Self Care | Payer: BC Managed Care – PPO | Source: Ambulatory Visit | Attending: Surgery | Admitting: Surgery

## 2020-05-05 DIAGNOSIS — Z20822 Contact with and (suspected) exposure to covid-19: Secondary | ICD-10-CM | POA: Insufficient documentation

## 2020-05-05 DIAGNOSIS — Z01812 Encounter for preprocedural laboratory examination: Secondary | ICD-10-CM | POA: Insufficient documentation

## 2020-05-05 LAB — SARS CORONAVIRUS 2 (TAT 6-24 HRS): SARS Coronavirus 2: NEGATIVE

## 2020-05-08 ENCOUNTER — Encounter (HOSPITAL_COMMUNITY): Payer: Self-pay | Admitting: Surgery

## 2020-05-08 NOTE — Progress Notes (Signed)
Spoke with pt for pre-op call. Pt denies cardiac history, HTN or Diabetes. Pt states he had a blood clot in his right leg several years ago. Took blood thinner for a while and never has had another one.  Covid test done 05/05/20 and it's negative. Pt states he's been in quarantine since the test was done and understands that he stays in quarantine until he comes to the hospital tomorrow.

## 2020-05-08 NOTE — Anesthesia Preprocedure Evaluation (Addendum)
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 Bowel sounds: normal.  Peds  Hematology negative hematology ROS (+)   Anesthesia Other Findings   Reproductive/Obstetrics                            Anesthesia Physical Anesthesia Plan  ASA: II  Anesthesia Plan: General   Post-op Pain Management:    Induction: Intravenous  PONV Risk Score and Plan: 2 and Ondansetron, Dexamethasone, Midazolam and Treatment may vary due to age or medical condition  Airway Management Planned: Mask and LMA  Additional Equipment: None  Intra-op Plan:   Post-operative Plan: Extubation in OR  Informed Consent: I have reviewed the patients History and Physical, chart, labs and discussed the procedure including the risks, benefits and alternatives for the proposed anesthesia with the patient or authorized representative who has indicated his/her understanding and acceptance.     Dental advisory given  Plan Discussed with: CRNA  Anesthesia Plan Comments:        Anesthesia Quick Evaluation

## 2020-05-09 ENCOUNTER — Encounter (HOSPITAL_COMMUNITY)
Admission: RE | Disposition: A | Discharge status: Home / Self Care | Payer: Self-pay | Source: Home / Self Care | Attending: Surgery

## 2020-05-09 ENCOUNTER — Ambulatory Visit (HOSPITAL_COMMUNITY): Payer: BC Managed Care – PPO | Admitting: Anesthesiology

## 2020-05-09 ENCOUNTER — Other Ambulatory Visit: Payer: Self-pay

## 2020-05-09 ENCOUNTER — Encounter (HOSPITAL_COMMUNITY): Payer: Self-pay | Admitting: Surgery

## 2020-05-09 ENCOUNTER — Ambulatory Visit (HOSPITAL_COMMUNITY)
Admission: RE | Admit: 2020-05-09 | Discharge: 2020-05-09 | Disposition: A | Discharge status: Home / Self Care | Payer: BC Managed Care – PPO | Attending: Surgery | Admitting: Surgery

## 2020-05-09 DIAGNOSIS — L732 Hidradenitis suppurativa: Secondary | ICD-10-CM | POA: Insufficient documentation

## 2020-05-09 DIAGNOSIS — F17200 Nicotine dependence, unspecified, uncomplicated: Secondary | ICD-10-CM | POA: Insufficient documentation

## 2020-05-09 HISTORY — PX: HYDRADENITIS EXCISION: SHX5243

## 2020-05-09 HISTORY — DX: Unspecified convulsions: R56.9

## 2020-05-09 HISTORY — DX: Peripheral vascular disease, unspecified: I73.9

## 2020-05-09 LAB — COMPREHENSIVE METABOLIC PANEL
ALT: 20 U/L (ref 0–44)
AST: 16 U/L (ref 15–41)
Albumin: 2.3 g/dL — ABNORMAL LOW (ref 3.5–5.0)
Alkaline Phosphatase: 112 U/L (ref 38–126)
Anion gap: 7 (ref 5–15)
BUN: 9 mg/dL (ref 6–20)
CO2: 28 mmol/L (ref 22–32)
Calcium: 8.6 mg/dL — ABNORMAL LOW (ref 8.9–10.3)
Chloride: 99 mmol/L (ref 98–111)
Creatinine, Ser: 0.87 mg/dL (ref 0.61–1.24)
GFR, Estimated: >60 mL/min (ref >60)
Glucose, Bld: 99 mg/dL (ref 70–99)
Potassium: 3.7 mmol/L (ref 3.5–5.1)
Sodium: 134 mmol/L — ABNORMAL LOW (ref 135–145)
Total Bilirubin: 0.3 mg/dL (ref 0.3–1.2)
Total Protein: 7.2 g/dL (ref 6.5–8.1)

## 2020-05-09 LAB — CBC WITH DIFFERENTIAL/PLATELET
Abs Immature Granulocytes: 0.11 10*3/uL — ABNORMAL HIGH (ref 0.00–0.07)
Basophils Absolute: 0.1 10*3/uL (ref 0.0–0.1)
Basophils Relative: 1 %
Eosinophils Absolute: 0.2 10*3/uL (ref 0.0–0.5)
Eosinophils Relative: 1 %
HCT: 29.8 % — ABNORMAL LOW (ref 39.0–52.0)
Hemoglobin: 9 g/dL — ABNORMAL LOW (ref 13.0–17.0)
Immature Granulocytes: 1 %
Lymphocytes Relative: 15 %
Lymphs Abs: 2.3 10*3/uL (ref 0.7–4.0)
MCH: 23.1 pg — ABNORMAL LOW (ref 26.0–34.0)
MCHC: 30.2 g/dL (ref 30.0–36.0)
MCV: 76.4 fL — ABNORMAL LOW (ref 80.0–100.0)
Monocytes Absolute: 1.4 10*3/uL — ABNORMAL HIGH (ref 0.1–1.0)
Monocytes Relative: 9 %
Neutro Abs: 12 10*3/uL — ABNORMAL HIGH (ref 1.7–7.7)
Neutrophils Relative %: 73 %
Platelets: 498 10*3/uL — ABNORMAL HIGH (ref 150–400)
RBC: 3.9 MIL/uL — ABNORMAL LOW (ref 4.22–5.81)
RDW: 15.6 % — ABNORMAL HIGH (ref 11.5–15.5)
WBC: 16 10*3/uL — ABNORMAL HIGH (ref 4.0–10.5)
nRBC: 0 % (ref 0.0–0.2)

## 2020-05-09 SURGERY — EXCISION, HIDRADENITIS, INGUINAL REGION
Anesthesia: General | Site: Perineum

## 2020-05-09 MED ORDER — DEXAMETHASONE SODIUM PHOSPHATE 10 MG/ML IJ SOLN
INTRAMUSCULAR | Status: DC | PRN
  Administered 2020-05-09: 10 mg via INTRAVENOUS

## 2020-05-09 MED ORDER — IBUPROFEN 800 MG PO TABS: 800.0000 mg | ORAL_TABLET | Freq: Three times a day (TID) | ORAL | 0 refills | Status: DC | PRN

## 2020-05-09 MED ORDER — MIDAZOLAM HCL 2 MG/2ML IJ SOLN
INTRAMUSCULAR | Status: AC
  Filled 2020-05-09: qty 2

## 2020-05-09 MED ORDER — CHLORHEXIDINE GLUCONATE CLOTH 2 % EX PADS: 6.0000 | MEDICATED_PAD | Freq: Once | CUTANEOUS | Status: DC

## 2020-05-09 MED ORDER — GABAPENTIN 300 MG PO CAPS
300.0000 mg | ORAL_CAPSULE | ORAL | Status: AC
  Administered 2020-05-09: 300 mg via ORAL
  Filled 2020-05-09: qty 1

## 2020-05-09 MED ORDER — LIDOCAINE 2% (20 MG/ML) 5 ML SYRINGE
INTRAMUSCULAR | Status: DC | PRN
  Administered 2020-05-09: 60 mg via INTRAVENOUS

## 2020-05-09 MED ORDER — OXYCODONE HCL 5 MG PO TABS: 5.0000 mg | ORAL_TABLET | Freq: Four times a day (QID) | ORAL | 0 refills | Status: DC | PRN

## 2020-05-09 MED ORDER — BUPIVACAINE HCL (PF) 0.25 % IJ SOLN
INTRAMUSCULAR | Status: DC | PRN
  Administered 2020-05-09: 30 mL

## 2020-05-09 MED ORDER — BUPIVACAINE HCL (PF) 0.25 % IJ SOLN
INTRAMUSCULAR | Status: AC
  Filled 2020-05-09: qty 30

## 2020-05-09 MED ORDER — HYDROMORPHONE HCL 1 MG/ML IJ SOLN
0.2500 mg | INTRAMUSCULAR | Status: DC | PRN
Start: 2020-05-09 — End: 2020-05-09

## 2020-05-09 MED ORDER — PROPOFOL 10 MG/ML IV BOLUS
INTRAVENOUS | Status: DC | PRN
  Administered 2020-05-09: 150 mg via INTRAVENOUS

## 2020-05-09 MED ORDER — FENTANYL CITRATE (PF) 250 MCG/5ML IJ SOLN
INTRAMUSCULAR | Status: DC | PRN
  Administered 2020-05-09 (×10): 50 ug via INTRAVENOUS

## 2020-05-09 MED ORDER — KETOROLAC TROMETHAMINE 30 MG/ML IJ SOLN
INTRAMUSCULAR | Status: DC | PRN
  Administered 2020-05-09: 15 mg via INTRAVENOUS

## 2020-05-09 MED ORDER — FENTANYL CITRATE (PF) 250 MCG/5ML IJ SOLN
INTRAMUSCULAR | Status: AC
  Filled 2020-05-09: qty 5

## 2020-05-09 MED ORDER — ACETAMINOPHEN 10 MG/ML IV SOLN: 1000.0000 mg | Freq: Once | INTRAVENOUS | Status: DC | PRN

## 2020-05-09 MED ORDER — OXYCODONE HCL 5 MG/5ML PO SOLN: 5.0000 mg | Freq: Once | ORAL | Status: AC | PRN

## 2020-05-09 MED ORDER — ORAL CARE MOUTH RINSE: 15.0000 mL | Freq: Once | OROMUCOSAL | Status: AC

## 2020-05-09 MED ORDER — 0.9 % SODIUM CHLORIDE (POUR BTL) OPTIME
TOPICAL | Status: DC | PRN
  Administered 2020-05-09: 1000 mL

## 2020-05-09 MED ORDER — CHLORHEXIDINE GLUCONATE 0.12 % MT SOLN
15.0000 mL | Freq: Once | OROMUCOSAL | Status: AC
  Administered 2020-05-09: 15 mL via OROMUCOSAL
  Filled 2020-05-09: qty 15

## 2020-05-09 MED ORDER — PROMETHAZINE HCL 25 MG/ML IJ SOLN: 6.2500 mg | INTRAMUSCULAR | Status: DC | PRN

## 2020-05-09 MED ORDER — ACETAMINOPHEN 500 MG PO TABS: 1000.0000 mg | ORAL_TABLET | ORAL | Status: DC

## 2020-05-09 MED ORDER — LACTATED RINGERS IV SOLN: INTRAVENOUS | Status: DC

## 2020-05-09 MED ORDER — BUPIVACAINE LIPOSOME 1.3 % IJ SUSP
20.0000 mL | Freq: Once | INTRAMUSCULAR | Status: DC
  Filled 2020-05-09: qty 20

## 2020-05-09 MED ORDER — HYDROMORPHONE HCL 1 MG/ML IJ SOLN
INTRAMUSCULAR | Status: AC
  Filled 2020-05-09: qty 1

## 2020-05-09 MED ORDER — MIDAZOLAM HCL 2 MG/2ML IJ SOLN
INTRAMUSCULAR | Status: DC | PRN
  Administered 2020-05-09: 2 mg via INTRAVENOUS

## 2020-05-09 MED ORDER — CEFAZOLIN SODIUM-DEXTROSE 2-4 GM/100ML-% IV SOLN
2.0000 g | INTRAVENOUS | Status: AC
  Administered 2020-05-09: 2 g via INTRAVENOUS
  Filled 2020-05-09: qty 100

## 2020-05-09 MED ORDER — PROPOFOL 10 MG/ML IV BOLUS
INTRAVENOUS | Status: AC
  Filled 2020-05-09: qty 40

## 2020-05-09 MED ORDER — KETOROLAC TROMETHAMINE 30 MG/ML IJ SOLN
INTRAMUSCULAR | Status: AC
  Filled 2020-05-09: qty 1

## 2020-05-09 MED ORDER — ACETAMINOPHEN 10 MG/ML IV SOLN
INTRAVENOUS | Status: AC
  Filled 2020-05-09: qty 100

## 2020-05-09 MED ORDER — OXYCODONE HCL 5 MG PO TABS
5.0000 mg | ORAL_TABLET | Freq: Once | ORAL | Status: AC | PRN
  Administered 2020-05-09: 5 mg via ORAL

## 2020-05-09 MED ORDER — OXYCODONE HCL 5 MG PO TABS
ORAL_TABLET | ORAL | Status: AC
  Filled 2020-05-09: qty 1

## 2020-05-09 MED ORDER — PROPOFOL 10 MG/ML IV BOLUS
INTRAVENOUS | Status: AC
  Filled 2020-05-09: qty 20

## 2020-05-09 MED ORDER — ONDANSETRON HCL 4 MG/2ML IJ SOLN
INTRAMUSCULAR | Status: DC | PRN
  Administered 2020-05-09: 4 mg via INTRAVENOUS

## 2020-05-09 SURGICAL SUPPLY — 34 items
BNDG GAUZE ELAST 4 BULKY (GAUZE/BANDAGES/DRESSINGS) ×2 IMPLANT
BRIEF STRETCH FOR OB PAD LRG (UNDERPADS AND DIAPERS) ×2 IMPLANT
CANISTER SUCT 3000ML PPV (MISCELLANEOUS) ×2 IMPLANT
COVER SURGICAL LIGHT HANDLE (MISCELLANEOUS) ×2 IMPLANT
COVER WAND RF STERILE (DRAPES) IMPLANT
DRSG PAD ABDOMINAL 8X10 ST (GAUZE/BANDAGES/DRESSINGS) ×2 IMPLANT
ELECT REM PT RETURN 9FT ADLT (ELECTROSURGICAL) ×2
ELECTRODE REM PT RTRN 9FT ADLT (ELECTROSURGICAL) ×1 IMPLANT
GAUZE PACKING IODOFORM 1/2 (PACKING) IMPLANT
GAUZE SPONGE 4X4 12PLY STRL (GAUZE/BANDAGES/DRESSINGS) ×2 IMPLANT
GLOVE BIO SURGEON STRL SZ8 (GLOVE) ×2 IMPLANT
GLOVE SRG 8 PF TXTR STRL LF DI (GLOVE) ×1 IMPLANT
GLOVE SURG UNDER POLY LF SZ8 (GLOVE) ×2
GOWN STRL REUS W/ TWL LRG LVL3 (GOWN DISPOSABLE) ×1 IMPLANT
GOWN STRL REUS W/ TWL XL LVL3 (GOWN DISPOSABLE) ×1 IMPLANT
GOWN STRL REUS W/TWL LRG LVL3 (GOWN DISPOSABLE) ×2
GOWN STRL REUS W/TWL XL LVL3 (GOWN DISPOSABLE) ×2
KIT BASIN OR (CUSTOM PROCEDURE TRAY) ×2 IMPLANT
KIT TURNOVER KIT B (KITS) ×2 IMPLANT
NEEDLE HYPO 25GX1X1/2 BEV (NEEDLE) IMPLANT
NS IRRIG 1000ML POUR BTL (IV SOLUTION) ×2 IMPLANT
PACK LITHOTOMY IV (CUSTOM PROCEDURE TRAY) ×2 IMPLANT
PAD ARMBOARD 7.5X6 YLW CONV (MISCELLANEOUS) ×2 IMPLANT
PENCIL SMOKE EVACUATOR (MISCELLANEOUS) ×2 IMPLANT
SPONGE LAP 18X18 RF (DISPOSABLE) ×2 IMPLANT
SWAB COLLECTION DEVICE MRSA (MISCELLANEOUS) IMPLANT
SWAB CULTURE ESWAB REG 1ML (MISCELLANEOUS) IMPLANT
SYR BULB IRRIG 60ML STRL (SYRINGE) ×2 IMPLANT
SYR CONTROL 10ML LL (SYRINGE) IMPLANT
TOWEL GREEN STERILE (TOWEL DISPOSABLE) ×2 IMPLANT
TOWEL GREEN STERILE FF (TOWEL DISPOSABLE) ×2 IMPLANT
TUBE CONNECTING 12X1/4 (SUCTIONS) ×2 IMPLANT
UNDERPAD 30X36 HEAVY ABSORB (UNDERPADS AND DIAPERS) ×2 IMPLANT
YANKAUER SUCT BULB TIP NO VENT (SUCTIONS) ×2 IMPLANT

## 2020-05-09 NOTE — Op Note (Signed)
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  He was initially seen by me back in 2015 but extensive disease refer to multiple tertiary care centers for consideration of debridement and skin grafting.  Given his smoking history, this was not recommended.  After multiple visits and discussions of what can be offered from the standpoint, we talked about serial debridements over the course of 12 to 18 months to slowly remove this tissue and allowed to heal by secondary intention.  I explained he may require urologist some point to deal with the scrotum which has been a chronic issue for him for many years as well.  After discussion the pros and cons of this approach as well as complications limitations in his circumstance, he wished to proceed with serial debridement understanding this would take multiple operations over potentially an 49-month  Depending on how he did and it was no guaranteed to cure him of this disease process.  We also talk about medical options of newer drug treatments for this disease and referral to specialist who are able to give these treatments at some point to hopefully prevent recurrences and/or help treat some of the minor flareups of his hidradenitis.The procedure has been discussed with the patient.  Alternative therapies have been discussed with the patient.  Operative risks include bleeding,  Infection,  Organ injury,  Nerve injury,  Blood vessel injury,  DVT,  Pulmonary embolism,  Death,  And possible reoperation.  Medical management risks  include worsening of present situation.  The success of the procedure is 50 -90 % at treating patients symptoms.  The patient understands and agrees to proceed.   Description of procedure: The patient was met in the holding area the procedure was reviewed.  All questions were answered.  He was taken back to the operating.  He is placed upon upon the OR table after induction of general esthesia was placed in lithotomy.  He was padded appropriately to all extremities and he was prepped and draped in sterile fashion.  Timeout was then done after sterile prep and drape.  In the left groin region, he had a 15 x 6 cm area of hidradenitis which appeared acutely inflamed extend down into his left inner thigh.  This marked with a pen.  There is a second area on the left buttock about 5 cm from the anal verge and a third area on the right buttock about 5 cm from anal verge.  There were multiple sinus tracts involving scrotum and right groin region but this was actually quite quiescent today.  I felt the more acute area should be excised today and I talk with his preoperative patient as well.  Local anesthetic was demonstrated in these regions.  A scalpel was used to ask incise the tissue which was quite boggy and had multiple tracts as well as granulation tissue which was quite chronic.  Cautery was used to excise the skin down to healthy subcutaneous tissues.  Bleeding points were controlled with cautery.  This was done to the left inner thigh and then extended down into the left buttock.  On the right this  was excised in a similar fashion.  Excision on the left was 15 cm x 6 cm with about a 1 cm depth and on the right was 4 cm x 5 cm with a 1 cm depth.  I felt this was in the excision for 1 day and took care of more of his acute issues today.  The areas were irrigated copiously with saline.  After ensuring excellent hemostasis a wet-to-dry dressing was placed and mesh panties were placed on the patient.  He was then  taken out of lithotomy at that point extubated taken recovery in satisfactory condition.  All final counts were found to be correct.  Home health will be ordered to help assist wound care which the patient understands.  He will continue his home medications as before.                            Excisional debridement:  1.  Progress note or procedure note with a detailed description of the procedure.  2.  Tool used for debridement (curette, scapel, etc.) scalpel cautery  3.  Frequency of surgical debridement.   First  4.  Measurement of total devitalized tissue (wound surface) before and after surgical debridement.   Left perineum: 15 cm x 6 cm, right buttock 4 cm x 5 cm  5.  Area and depth of devitalized tissue removed from wound.  110 cm x 1 cm in depth  6.  Blood loss and description of tissue removed.  30 cc, hidradenitis with multiple fistulas and abscesses  7.  Evidence of the progress of the wound's response to treatment.  A.  Current wound volume (current dimensions and depth).  15 cm x 6 cm x 1 cm and 4 cm x 5 cm x 1 cm  B.  Presence (and extent of) of infection.  Mild  C.  Presence (and extent of) of non viable tissue.  Minimal  D.  Other material in the wound that is expected to inhibit healing.  Scar tissue and granulation tissue  8.  Was there any viable tissue removed (measurements): 15 cm x 6 cm and 4 cm x 5 cm

## 2020-05-09 NOTE — Anesthesia Postprocedure Evaluation (Signed)
Anesthesia Post Note  Patient: Adam Christian  Procedure(s) Performed: DEBRIDEMENT OF RIGHT GROIN AND LEFT BUTTOCK HIDRADENITIS (N/A Perineum)     Patient location during evaluation: PACU Anesthesia Type: General Level of consciousness: awake and alert Pain management: pain level controlled Vital Signs Assessment: post-procedure vital signs reviewed and stable Respiratory status: spontaneous breathing, nonlabored ventilation, respiratory function stable and patient connected to nasal cannula oxygen Cardiovascular status: blood pressure returned to baseline and stable Postop Assessment: no apparent nausea or vomiting Anesthetic complications: no   No complications documented.  Last Vitals:  Vitals:   05/09/20 0928 05/09/20 0929  BP:  130/84  Pulse:    Resp:    Temp: 36.4 C   SpO2:      Last Pain:  Vitals:   05/09/20 0915  TempSrc:   PainSc: Asleep                 Nelle Don Jerrol Helmers

## 2020-05-09 NOTE — Transfer of Care (Signed)
Immediate Anesthesia Transfer of Care Note  Patient: Adam Christian  Procedure(s) Performed: DEBRIDEMENT OF RIGHT GROIN AND LEFT BUTTOCK HIDRADENITIS (N/A Perineum)  Patient Location: PACU  Anesthesia Type:General  Level of Consciousness: awake, alert  and patient cooperative  Airway & Oxygen Therapy: Patient Spontanous Breathing  Post-op Assessment: Report given to RN and Post -op Vital signs reviewed and stable  Post vital signs: Reviewed and stable  Last Vitals:  Vitals Value Taken Time  BP 157/92 05/09/20 0902  Temp    Pulse 63 05/09/20 0903  Resp 18 05/09/20 0903  SpO2 100 % 05/09/20 0903  Vitals shown include unvalidated device data.  Last Pain:  Vitals:   05/09/20 0608  TempSrc:   PainSc: 8       Patients Stated Pain Goal: 3 (05/09/20 7096)  Complications: No complications documented.

## 2020-05-09 NOTE — H&P (Signed)
Adam Christian  Location: [facility] Patient #: [id number] DOB: 14-Dec-1980 Single / Language: [name] / Race: Black or African American Male  History of Present Illness Patient words: Patient returns for follow-up of his extensive chronic hidradenitis. He was referred posture to last year but given his tobacco abuse was not felt to be a suitable candidate for skin grafting or flap closure. He is done fairly well until recently we developed a severe flareup especially involving his left gluteal region into his left groin and right groin. He's been placed on antibiotics and now is her primary care doctor which is helpful to him. He continues to smoke. He returns today for follow-up. No fever or chills. Warm tub soaks of help in the antibiotics he thinks are helping as well.  The patient is a 40 year old male.    Physical Exam  Integumentary Note: Extensive left gluteal and left inner thigh hidradenitis noted. Right buttock looks normal today. His right groin also has extensive hidradenitis extending onto which a saphenous scrotum. Sinus tracts or draining injury does not appear to be any undrained fluid collection considered. He is tender in both areas states he's feeling better since antibiotics were started.   Chest and Lung Exam Chest and lung exam reveals -quiet, even and easy respiratory effort with no use of accessory muscles and on auscultation, normal breath sounds, no adventitious sounds and normal vocal resonance. Inspection Chest Wall - Normal. Back - normal.  Cardiovascular Cardiovascular examination reveals -on palpation PMI is normal in location and amplitude, no palpable S3 or S4. Normal cardiac borders., normal heart sounds, regular rate and rhythm with no murmurs, carotid auscultation reveals no bruits and normal pedal pulses bilaterally.  Neurologic Neurologic evaluation reveals -alert and oriented x 3 with no impairment of recent or remote  memory. Mental Status-Normal.    Assessment & Plan (  HIDRADENITIS (L73.2) Impression: left buttock and right groin  axilla stable currently still smoking   Explained that soft tissue coverage would not be possible endovascular repair would be to irrigate and debride the area the best we could and pocket for symptomatic relief. This will not unfortunately clearly and as long as he keeps smoking is no reason to try to do that which I explained to him. Since this is been a fairly significant flareup he is in agreement for debridement of his left buttock and right groin and packing with wound care. Unfortunately until he stopped smoking no be no way to cover these areas with a skin graft for flap is aware of that. I strongly encouraged him to stop smoking since this makes the disease much worse in anyway. Risk of bleeding, infection, further debridement necessary, wound packing and wound care issues, possible significant scarring and contracture. Risk of blood vessel injury nerve injury and chronic pain discussed especially if he keeps smoking.  Current Plans Pt Education - [facility] Free Text Education/Instructions: discussed with patient and provided information.

## 2020-05-09 NOTE — Discharge Instructions (Signed)
Home health will start tomorrow for wound care  Ok to keep dressings on today  If they fall off, can cover with gauze and home health can change tomorrow  Expect drainage and discharge   Pain meds at your pharmacy   Took initial dose of Oxycodone 5mg  at 9:11 am today ( MB, rn)

## 2020-05-09 NOTE — Interval H&P Note (Signed)
History and Physical Interval Note:  05/09/2020 7:22 AM  Adam Christian  has presented today for surgery, with the diagnosis of hidradenitis.  The various methods of treatment have been discussed with the patient and family. After consideration of risks, benefits and other options for treatment, the patient has consented to  Procedure(s): DEBRIDEMENT OF RIGHT GROIN AND LEFT BUTTOCK HIDRADENITIS (N/A) as a surgical intervention.  The patient's history has been reviewed, patient examined, no change in status, stable for surgery.  I have reviewed the patient's chart and labs.  Questions were answered to the patient's satisfaction.     Dortha Schwalbe MD

## 2020-05-09 NOTE — Anesthesia Procedure Notes (Signed)
Procedure Name: LMA Insertion Date/Time: 05/09/2020 7:39 AM Performed by: Drema Pry, CRNA Pre-anesthesia Checklist: Patient identified, Emergency Drugs available, Suction available and Patient being monitored Patient Re-evaluated:Patient Re-evaluated prior to induction Oxygen Delivery Method: Circle System Utilized Preoxygenation: Pre-oxygenation with 100% oxygen Induction Type: IV induction LMA: LMA inserted LMA Size: 4.0 Number of attempts: 1 Airway Equipment and Method: Bite block Placement Confirmation: positive ETCO2 Tube secured with: Tape Dental Injury: Teeth and Oropharynx as per pre-operative assessment

## 2020-05-10 ENCOUNTER — Encounter (HOSPITAL_COMMUNITY): Payer: Self-pay | Admitting: Surgery

## 2020-05-10 LAB — SURGICAL PATHOLOGY

## 2020-05-18 ENCOUNTER — Encounter: Payer: BC Managed Care – PPO | Attending: Physician Assistant | Admitting: Physician Assistant

## 2020-05-18 ENCOUNTER — Other Ambulatory Visit: Payer: Self-pay

## 2020-05-18 DIAGNOSIS — Z888 Allergy status to other drugs, medicaments and biological substances status: Secondary | ICD-10-CM | POA: Insufficient documentation

## 2020-05-18 DIAGNOSIS — F17200 Nicotine dependence, unspecified, uncomplicated: Secondary | ICD-10-CM | POA: Diagnosis not present

## 2020-05-18 DIAGNOSIS — L732 Hidradenitis suppurativa: Secondary | ICD-10-CM | POA: Insufficient documentation

## 2020-05-18 DIAGNOSIS — L98492 Non-pressure chronic ulcer of skin of other sites with fat layer exposed: Secondary | ICD-10-CM | POA: Diagnosis not present

## 2020-05-18 DIAGNOSIS — Z87892 Personal history of anaphylaxis: Secondary | ICD-10-CM | POA: Insufficient documentation

## 2020-05-18 NOTE — Progress Notes (Signed)
Adam, Christian ([id number]) Visit Report for 05/18/2020 Allergy List Details Patient Name: Adam, Christian Date of Service: 05/18/2020 1:00 PM Medical Record Number: [id number] Patient Account Number: [id number] Date of Birth/Sex: November 04, 1980 (39 y.o. Male) Treating RN: Hansel Feinstein Primary Care Gresia Isidoro: Leilani Able Other Clinician: Lolita Cram Referring Sheng Pritz: Harriette Bouillon Treating Audelia Knape/Extender: Rowan Blase in Treatment: 0 Allergies Active Allergies iodine Reaction: anaphylactic Severity: Severe Allergy Notes Electronic Signature(s) Signed: 05/18/2020 5:09:19 PM By: Hansel Feinstein Entered By: Hansel Feinstein on 05/18/2020 13:25:03 Adam Christian ([id number]) -------------------------------------------------------------------------------- Arrival Information Details Patient Name: Adam Christian Date of Service: 05/18/2020 1:00 PM Medical Record Number: [id number] Patient Account Number: [id number] Date of Birth/Sex: 12/01/1980 (39 y.o. Male) Treating RN: Hansel Feinstein Primary Care Tanish Prien: Leilani Able Other Clinician: Lolita Cram Referring Cieara Stierwalt: Harriette Bouillon Treating Miguel Christiana/Extender: Rowan Blase in Treatment: 0 Visit Information Patient Arrived: [name] Time: 13:19 Accompanied By: self Transfer Assistance: None Patient Identification Verified: Yes Secondary Verification Process Completed: Yes Electronic Signature(s) Signed: 05/18/2020 5:09:19 PM By: Hansel Feinstein Entered By: Hansel Feinstein on 05/18/2020 13:20:01 Adam Christian ([id number]) -------------------------------------------------------------------------------- Clinic Level of Care Assessment Details Patient Name: Adam Christian Date of Service: 05/18/2020 1:00 PM Medical Record Number: [id number] Patient Account Number: [id number] Date of Birth/Sex: [birth year] (39 y.o. Male) Treating RN: Rogers Blocker Primary Care Missie Gehrig: Leilani Able Other Clinician: Lolita Cram Referring  Joycie Aerts: Harriette Bouillon Treating Mary-Ann Pennella/Extender: Rowan Blase in Treatment: 0 Clinic Level of Care Assessment Items TOOL 4 Quantity Score X - Use when only an EandM is performed on FOLLOW-UP visit 1 0 ASSESSMENTS - Nursing Assessment / Reassessment X - Reassessment of Co-morbidities (includes updates in patient status) 1 10 X- 1 5 Reassessment of Adherence to Treatment Plan ASSESSMENTS - Wound and Skin Assessment / Reassessment []  - Simple Wound Assessment / Reassessment - one wound 0 X- 2 5 Complex Wound Assessment / Reassessment - multiple wounds []  - 0 Dermatologic / Skin Assessment (not related to wound area) ASSESSMENTS - Focused Assessment []  - Circumferential Edema Measurements - multi extremities 0 []  - 0 Nutritional Assessment / Counseling / Intervention []  - 0 Lower Extremity Assessment (monofilament, tuning fork, pulses) []  - 0 Peripheral Arterial Disease Assessment (using hand held doppler) ASSESSMENTS - Ostomy and/or Continence Assessment and Care []  - Incontinence Assessment and Management 0 []  - 0 Ostomy Care Assessment and Management (repouching, etc.) PROCESS - Coordination of Care X - Simple Patient / Family Education for ongoing care 1 15 []  - 0 Complex (extensive) Patient / Family Education for ongoing care []  - 0 Staff obtains , Records, Test Results / Process Orders []  - 0 Staff telephones HHA, Nursing Homes / Clarify orders / etc []  - 0 Routine Transfer to another Facility (non-emergent condition) []  - 0 Routine Hospital Admission (non-emergent condition) []  - 0 New Admissions / / Ordering NPWT, Apligraf, etc. []  - 0 Emergency Hospital Admission (emergent condition) []  - 0 Simple Discharge Coordination []  - 0 Complex (extensive) Discharge Coordination PROCESS - Special Needs []  - Pediatric / Minor Patient Management 0 []  - 0 Isolation Patient Management []  - 0 Hearing / Language / Visual special  needs []  - 0 Assessment of Community assistance (transportation, D/C planning, etc.) []  - 0 Additional assistance / Altered mentation []  - 0 Support Surface(s) Assessment (bed, cushion, seat, etc.) INTERVENTIONS - Wound Cleansing / Measurement [name], [name] ( ) []  - 0 Simple Wound Cleansing - one wound X- 2 5 Complex Wound Cleansing - multiple wounds X- 1 5 Wound Imaging (  photographs - any number of wounds) []  - 0 Wound Tracing (instead of photographs) []  - 0 Simple Wound Measurement - one wound X- 2 5 Complex Wound Measurement - multiple wounds INTERVENTIONS - Wound Dressings []  - Small Wound Dressing one or multiple wounds 0 X- 1 15 Medium Wound Dressing one or multiple wounds []  - 0 Large Wound Dressing one or multiple wounds []  - 0 Application of Medications - topical []  - 0 Application of Medications - injection INTERVENTIONS - Miscellaneous []  - External ear exam 0 []  - 0 Specimen Collection (cultures, biopsies, blood, body fluids, etc.) []  - 0 Specimen(s) / Culture(s) sent or taken to Lab for analysis []  - 0 Patient Transfer (multiple staff / Nurse, adultHoyer Lift / Similar devices) []  - 0 Simple Staple / Suture removal (25 or less) []  - 0 Complex Staple / Suture removal (26 or more) []  - 0 Hypo / Hyperglycemic Management (close monitor of Blood Glucose) []  - 0 Ankle / Brachial Index (ABI) - do not check if billed separately X- 1 5 Vital Signs Has the patient been seen at the hospital within the last three years: Yes Total Score: 85 Level Of Care: New/Established - Level 3 Electronic Signature(s) Signed: 05/18/2020 5:35:33 PM By: Phillis HaggisSanchez Pereyda, Dondra PraderKenia RN Entered By: Phillis HaggisSanchez Pereyda, Dondra PraderKenia on 05/18/2020 14:35:27 Adam Christian, Adam ([id number]) -------------------------------------------------------------------------------- Lower Extremity Assessment Details Patient Name: Adam Christian, Adam Date of Service: 05/18/2020 1:00 PM Medical Record Number:  [id number] Patient Account Number: [id number] Date of Birth/Sex: 1981-02-02 (39 y.o. Male) Treating RN: Hansel FeinsteinBishop, Joy Primary Care Earle Burson: Leilani Ableeese, Betti Other Clinician: Lolita CramBurnette, Kyara Referring Dary Dilauro: Harriette BouillonORNETT, THOMAS Treating Antanisha Mohs/Extender: Allen DerryStone, Hoyt Weeks in Treatment: 0 Electronic Signature(s) Signed: 05/18/2020 5:09:19 PM By: Hansel FeinsteinBishop, Joy Entered By: Hansel FeinsteinBishop, Joy on 05/18/2020 13:54:22 Vey, Durenda AgeRISTAN ([id number]) -------------------------------------------------------------------------------- Multi Wound Chart Details Patient Name: Adam Christian, Aikeem Date of Service: 05/18/2020 1:00 PM Medical Record Number: [id number] Patient Account Number: [id number] Date of Birth/Sex: 1981-02-02 (39 y.o. Male) Treating RN: Rogers BlockerSanchez, Kenia Primary Care Jayvion Stefanski: Leilani Ableeese, Betti Other Clinician: Lolita CramBurnette, Kyara Referring Teressa Mcglocklin: Harriette BouillonORNETT, THOMAS Treating Anthoney Sheppard/Extender: Rowan BlaseStone, Hoyt Weeks in Treatment: 0 Vital Signs Height(in): 66 Pulse(bpm): 73 Weight(lbs): 151 Blood Pressure(mmHg): 131/79 Body Mass Index(BMI): 24 Temperature(F): 98.7 Respiratory Rate(breaths/min): 18 Photos: [N/A:N/A] Wound Location: Right Gluteus Left Gluteus N/A Wounding Event: Gradually Appeared Gradually Appeared N/A Primary Etiology: Hidradenitis Hidradenitis N/A Comorbid History: Seizure Disorder Seizure Disorder N/A Date Acquired: 06/12/2018 06/12/2018 N/A Weeks of Treatment: 0 0 N/A Wound Status: Open Open N/A Measurements L x W x D (cm) 5.7x4.5x1 23.3x11x1.3 N/A Area (cm) : 20.145 201.298 N/A Volume (cm) : 20.145 261.687 N/A % Reduction in Area: 0.00% 0.00% N/A % Reduction in Volume: 0.00% 0.00% N/A Classification: Full Thickness Without Exposed Full Thickness Without Exposed N/A Support Structures Support Structures Exudate Amount: Large Large N/A Exudate Type: Serosanguineous Serosanguineous N/A Exudate Color: red, brown red, brown N/A Granulation Amount: Small (1-33%) Small (1-33%) N/A Granulation  Quality: Red Red N/A Necrotic Amount: Large (67-100%) Large (67-100%) N/A Exposed Structures: Fat Layer (Subcutaneous Tissue): Fat Layer (Subcutaneous Tissue): N/A Yes Yes Fascia: No Fascia: No Tendon: No Tendon: No Muscle: No Muscle: No Joint: No Joint: No Bone: No Bone: No Epithelialization: None None N/A Treatment Notes Electronic Signature(s) Signed: 05/18/2020 5:35:33 PM By: Phillis HaggisSanchez Pereyda, Dondra PraderKenia RN Entered By: Phillis HaggisSanchez Pereyda, Dondra PraderKenia on 05/18/2020 14:15:28 Adam Christian, Terrace ([id number]) -------------------------------------------------------------------------------- Multi-Disciplinary Care Plan Details Patient Name: Adam Christian, Mortimer Date of Service: 05/18/2020 1:00 PM Medical Record Number: [id number] Patient Account Number: [id number] Date of Birth/Sex: 1981-02-02 [age] y.o. Male)  Treating RN: Rogers Blocker Primary Care Burch Marchuk: Leilani Able Other Clinician: Lolita Cram Referring Lakeia Bradshaw: Harriette Bouillon Treating Kastin Cerda/Extender: Rowan Blase in Treatment: 0 Active Inactive Orientation to the Wound Care Program Nursing Diagnoses: Knowledge deficit related to the wound healing center program Goals: Patient/caregiver will verbalize understanding of the Wound Healing Center Program Date Initiated: 05/18/2020 Target Resolution Date: 05/18/2020 Goal Status: Active Interventions: Provide education on orientation to the wound center Notes: Wound/Skin Impairment Nursing Diagnoses: Impaired tissue integrity Goals: Patient/caregiver will verbalize understanding of skin care regimen Date Initiated: 05/18/2020 Target Resolution Date: 05/18/2020 Goal Status: Active Ulcer/skin breakdown will have a volume reduction of 30% by week 4 Date Initiated: 05/18/2020 Target Resolution Date: 06/17/2020 Goal Status: Active Ulcer/skin breakdown will have a volume reduction of 50% by week 8 Date Initiated: 05/18/2020 Target Resolution Date: 07/18/2020 Goal Status: Active Ulcer/skin  breakdown will have a volume reduction of 80% by week 12 Date Initiated: 05/18/2020 Target Resolution Date: 08/17/2020 Goal Status: Active Ulcer/skin breakdown will heal within 14 weeks Date Initiated: 05/18/2020 Target Resolution Date: 09/17/2020 Goal Status: Active Interventions: Assess patient/caregiver ability to obtain necessary supplies Assess patient/caregiver ability to perform ulcer/skin care regimen upon admission and as needed Assess ulceration(s) every visit Provide education on ulcer and skin care Treatment Activities: Referred to [facility] for dressing supplies : 05/18/2020 Skin care regimen initiated : 05/18/2020 Notes: Electronic Signature(s) Signed: 05/18/2020 5:35:33 PM By: Phillis Haggis, Dondra Prader RN Entered By: Phillis Haggis, Dondra Prader on 05/18/2020 14:15:07 ROBBEN, JAGIELLO ([id number]) CAMILLO, QUADROS ([id number]) -------------------------------------------------------------------------------- Pain Assessment Details Patient Name: Adam Christian Date of Service: 05/18/2020 1:00 PM Medical Record Number: [id number] Patient Account Number: [id number] Date of Birth/Sex: Mar 01, 1980 (39 y.o. Male) Treating RN: Hansel Feinstein Primary Care Rollande Thursby: Leilani Able Other Clinician: Lolita Cram Referring Depaul Arizpe: Harriette Bouillon Treating Shima Compere/Extender: Rowan Blase in Treatment: 0 Active Problems Location of Pain Severity and Description of Pain Patient Has Paino Yes Site Locations Pain Location: Pain in Ulcers Rate the pain. Current Pain Level: 5 Pain Management and Medication Current Pain Management: Electronic Signature(s) Signed: 05/18/2020 5:09:19 PM By: Hansel Feinstein Entered By: Hansel Feinstein on 05/18/2020 13:20:20 Adam Christian ([id number]) -------------------------------------------------------------------------------- Patient/Caregiver Education Details Patient Name: Adam Christian Date of Service: 05/18/2020 1:00 PM Medical Record Number:  [id number] Patient Account Number: [id number] Date of Birth/Gender: Jun 19, 1980 (39 y.o. Male) Treating RN: Rogers Blocker Primary Care Physician: Leilani Able Other Clinician: Lolita Cram Referring Physician: Harriette Bouillon Treating Physician/Extender: Rowan Blase in Treatment: 0 Education Assessment Education Provided To: Patient Education Topics Provided Wound/Skin Impairment: Methods: Explain/Verbal Responses: State content correctly Electronic Signature(s) Signed: 05/18/2020 5:35:33 PM By: Phillis Haggis, Dondra Prader RN Entered By: Phillis Haggis, Dondra Prader on 05/18/2020 14:35:40 Adam Christian ([id number]) -------------------------------------------------------------------------------- Wound Assessment Details Patient Name: Adam Christian Date of Service: 05/18/2020 1:00 PM Medical Record Number: [id number] Patient Account Number: [id number] Date of Birth/Sex: [birth year] (39 y.o. Male) Treating RN: Hansel Feinstein Primary Care Donis Kotowski: Leilani Able Other Clinician: Lolita Cram Referring Newman Waren: Harriette Bouillon Treating Ruweyda Macknight/Extender: Rowan Blase in Treatment: 0 Wound Status Wound Number: 1 Primary Etiology: Hidradenitis Wound Location: Right Gluteus Wound Status: Open Wounding Event: Gradually Appeared Comorbid History: Seizure Disorder Date Acquired: 06/12/2018 Weeks Of Treatment: 0 Clustered Wound: No Photos Wound Measurements Length: (cm) 5.7 Width: (cm) 4.5 Depth: (cm) 1 Area: (cm) 20.145 Volume: (cm) 20.145 % Reduction in Area: 0% % Reduction in Volume: 0% Epithelialization: None Wound Description Classification: Full Thickness Without Exposed Support Structu Exudate Amount: Large Exudate Type: Serosanguineous Exudate Color: red, brown res Foul  Odor After Cleansing: No Slough/Fibrino Yes Wound Bed Granulation Amount: Small (1-33%) Exposed Structure Granulation Quality: Red Fascia Exposed: No Necrotic Amount: Large (67-100%) Fat Layer  (Subcutaneous Tissue) Exposed: Yes Necrotic Quality: Adherent Slough Tendon Exposed: No Muscle Exposed: No Joint Exposed: No Bone Exposed: No Electronic Signature(s) Signed: 05/18/2020 5:09:19 PM By: Hansel Feinstein Entered By: Hansel Feinstein on 05/18/2020 14:02:47 Adam Christian ([id number]) -------------------------------------------------------------------------------- Wound Assessment Details Patient Name: Adam Christian Date of Service: 05/18/2020 1:00 PM Medical Record Number: [id number] Patient Account Number: [id number] Date of Birth/Sex: 12-Jul-1980 (39 y.o. Male) Treating RN: Hansel Feinstein Primary Care Neymar Dowe: Leilani Able Other Clinician: Lolita Cram Referring Isabel Freese: Harriette Bouillon Treating Amalee Olsen/Extender: Rowan Blase in Treatment: 0 Wound Status Wound Number: 2 Primary Etiology: Hidradenitis Wound Location: Left Gluteus Wound Status: Open Wounding Event: Gradually Appeared Comorbid History: Seizure Disorder Date Acquired: 06/12/2018 Weeks Of Treatment: 0 Clustered Wound: No Photos Wound Measurements Length: (cm) 23.3 Width: (cm) 11 Depth: (cm) 1.3 Area: (cm) 201.298 Volume: (cm) 261.687 % Reduction in Area: 0% % Reduction in Volume: 0% Epithelialization: None Tunneling: No Undermining: No Wound Description Classification: Full Thickness Without Exposed Support Structu Exudate Amount: Large Exudate Type: Serosanguineous Exudate Color: red, brown res Foul Odor After Cleansing: No Slough/Fibrino Yes Wound Bed Granulation Amount: Small (1-33%) Exposed Structure Granulation Quality: Red Fascia Exposed: No Necrotic Amount: Large (67-100%) Fat Layer (Subcutaneous Tissue) Exposed: Yes Necrotic Quality: Adherent Slough Tendon Exposed: No Muscle Exposed: No Joint Exposed: No Bone Exposed: No Electronic Signature(s) Signed: 05/18/2020 5:09:19 PM By: Hansel Feinstein Entered By: Hansel Feinstein on 05/18/2020 14:03:13 Adam Christian  ([id number]) -------------------------------------------------------------------------------- Vitals Details Patient Name: Adam Christian Date of Service: 05/18/2020 1:00 PM Medical Record Number: [id number] Patient Account Number: [id number] Date of Birth/Sex: Dec 02, 1980 (39 y.o. Male) Treating RN: Hansel Feinstein Primary Care Anniemae Haberkorn: Leilani Able Other Clinician: Lolita Cram Referring Madline Oesterling: Harriette Bouillon Treating Mel Langan/Extender: Rowan Blase in Treatment: 0 Vital Signs Time Taken: 13:20 Temperature (F): 98.7 Height (in): 66 Pulse (bpm): 73 Weight (lbs): 151 Respiratory Rate (breaths/min): 18 Source: Measured Blood Pressure (mmHg): 131/79 Body Mass Index (BMI): 24.4 Reference Range: 80 - 120 mg / dl Electronic Signature(s) Signed: 05/18/2020 5:09:19 PM By: Hansel Feinstein Entered ByHansel Feinstein on 05/18/2020 13:21:34

## 2020-05-18 NOTE — Progress Notes (Addendum)
Adam Christian, Adam Christian ([id number]) Visit Report for 05/18/2020 Chief Complaint Document Details Patient Name: Adam Christian, Adam Christian Date of Service: 05/18/2020 1:00 PM Medical Record Number: [id number] Patient Account Number: [id number] Date of Birth/Sex: [birth year] (40 y.o. Male) Treating RN: Dolan Amen Primary Care Provider: Lin Landsman Other Clinician: Jeanine Luz Referring Provider: Erroll Luna Treating Provider/Extender: Skipper Cliche in Treatment: 0 Information Obtained from: Patient Chief Complaint Bilateral gluteal, perineal, and groin ulceration due to hidradenitis Electronic Signature(s) Signed: 05/18/2020 2:00:38 PM By: Worthy Keeler PA-C Entered By: Worthy Keeler on 05/18/2020 14:00:38 GEN, CLAGG ([id number]) -------------------------------------------------------------------------------- HPI Details Patient Name: Adam Christian Date of Service: 05/18/2020 1:00 PM Medical Record Number: [id number] Patient Account Number: [id number] Date of Birth/Sex: 11-16-1980 (40 y.o. Male) Treating RN: Dolan Amen Primary Care Provider: Lin Landsman Other Clinician: Jeanine Luz Referring Provider: Erroll Luna Treating Provider/Extender: Skipper Cliche in Treatment: 0 History of Present Illness HPI Description: 05/18/2020 upon evaluation today patient appears to be doing poorly in regard to his wounds. This is actually a surgical wound at this point he has a longstanding history around 15 years of dealing with hidradenitis. Unfortunately he was getting increasingly tender and having a lot of discomfort subsequently Dr. Wilma Flavin who is the surgeon in [place] did actually take him to surgery to try to clear everything out to try to let this area to heal appropriately. Unfortunately that was not able to be done in 1 time according to what the patient tells me it seems that this is going require serial debridements. With that being said the patient  apparently is a smoker and this is still good to be a significant issue here as far as that is concerned. With a skin graft or flap that would obviously help to secure things much more quickly as far as healing is concerned but again he cannot be smoking if that is to be considered. He encouraged the patient that is Dr. Wilma Flavin to stop smoking which would also help ease the progression of the disease as well. The patient has no other major medical problems other than depressive disorder but he does not seem to be having issues with that particularly right now. He tells me he is currently out of pain medication though the 5 mg oxycodone was helping with dressing changes in the past. Electronic Signature(s) Signed: 05/18/2020 2:34:54 PM By: Worthy Keeler PA-C Entered By: Worthy Keeler on 05/18/2020 14:34:53 Adam Christian ([id number]) -------------------------------------------------------------------------------- Physical Exam Details Patient Name: Adam Christian Date of Service: 05/18/2020 1:00 PM Medical Record Number: [id number] Patient Account Number: [id number] Date of Birth/Sex: 03-09-1980 (40 y.o. Male) Treating RN: Dolan Amen Primary Care Provider: Lin Landsman Other Clinician: Jeanine Luz Referring Provider: Erroll Luna Treating Provider/Extender: Skipper Cliche in Treatment: 0 Constitutional supine blood pressure is within target range for patient.. pulse regular and within target range for patient.Marland Kitchen respirations regular, non-labored and within target range for patient.Marland Kitchen temperature within target range for patient.. Well-nourished and well-hydrated in no acute distress. Eyes conjunctiva clear no eyelid edema noted. pupils equal round and reactive to light and accommodation. Ears, Nose, Mouth, and Throat no gross abnormality of ear auricles or external auditory canals. normal hearing noted during conversation. mucus membranes moist. Respiratory normal breathing  without difficulty. Musculoskeletal Patient unable to walk without assistance. Psychiatric this patient is able to make decisions and demonstrates good insight into disease process. Alert and Oriented x 3. pleasant and cooperative. Notes Upon evaluation today the patient appears to show signs of  having significant issues currently with significant wounds in the bilateral gluteal, perineal, and groin area. This unfortunately is a significant surgical ulcer at this point and again does not seem to be healing nearly as well as we would love to see but again probably as good as can be expected considering what is going on. The patient tells me it is better than prior to the surgery when he had a significant fluid pocket that was causing significant issues. Nonetheless there is still quite a ways to go to see any kind of complete resolution and to be honest he may still need to see a specialist at [facility] such as plastic surgery to try to close this with a flap but that cannot happen while he is smoking. Electronic Signature(s) Signed: 05/18/2020 2:36:06 PM By: Worthy Keeler PA-C Entered By: Worthy Keeler on 05/18/2020 14:36:06 Adam Christian ([id number]) -------------------------------------------------------------------------------- Physician Orders Details Patient Name: Adam Christian Date of Service: 05/18/2020 1:00 PM Medical Record Number: [id number] Patient Account Number: [id number] Date of Birth/Sex: 01/30/1981 (40 y.o. Male) Treating RN: Dolan Amen Primary Care Provider: Lin Landsman Other Clinician: Jeanine Luz Referring Provider: Erroll Luna Treating Provider/Extender: Skipper Cliche in Treatment: 0 Verbal / Phone Orders: No Diagnosis Coding ICD-10 Coding Code Description L73.2 Hidradenitis suppurativa L98.492 Non-pressure chronic ulcer of skin of other sites with fat layer exposed F33.9 Major depressive disorder, recurrent, unspecified Follow-up  Appointments o Return Appointment in 1 week. [place]: - [facility] o ADMIT to [facility] for wound care. May utilize formulary equivalent dressing for wound treatment orders unless otherwise specified. Home Health Nurse may visit PRN to address patientos wound care needs. - 3 times a week to assisted with dressing changes o Scheduled days for dressing changes to be completed; exception, patient has scheduled wound care visit that day. o **Please direct any NON-WOUND related issues/requests for orders to patient's Primary Care Physician. **If current dressing causes regression in wound condition, may D/C ordered dressing product/s and apply Normal Saline Moist Dressing daily until next [facility] or Other MD appointment. **Notify [facility] of regression in wound condition at [phone]. Bathing/ Shower/ Hygiene o Clean wound with Normal Saline or wound cleanser. Wound Treatment Wound #1 - Gluteus Wound Laterality: Right Cleanser: Byram Ancillary Kit - 15 Day Supply (Generic) 1 x Per Day/30 Days Discharge Instructions: Use supplies as instructed; Kit contains: (15) Saline Bullets; (15) 3x3 Gauze; 15 pr Gloves Secondary Dressing: ABD Pad 5x9 (in/in) (Generic) 1 x Per Day/30 Days Discharge Instructions: Cover with ABD pad Secondary Dressing: Conforming Guaze Roll-Medium 1 x Per Day/30 Days Discharge Instructions: Moistened with Dakins Solution, pack in wound Secured With: 102M Medipore H Soft Cloth Surgical Tape, 2x2 (in/yd) (Generic) 1 x Per Day/30 Days Wound #2 - Gluteus Wound Laterality: Left Cleanser: Byram Ancillary Kit - 15 Day Supply (Generic) 1 x Per Day/30 Days Discharge Instructions: Use supplies as instructed; Kit contains: (15) Saline Bullets; (15) 3x3 Gauze; 15 pr Gloves Secondary Dressing: ABD Pad 5x9 (in/in) (Generic) 1 x Per Day/30 Days Discharge Instructions: Cover with ABD pad Secondary Dressing: Conforming Guaze  Roll-Medium (Generic) 1 x Per Day/30 Days Discharge Instructions: Moistened with Dakins Solution, pack in wound Secondary Dressing: Kerlix 4.5 x 4.1 (in/yd) (Generic) 1 x Per Day/30 Days Discharge Instructions: Apply Kerlix 4.5 x 4.1 (in/yd) as instructed Secured With: 102M Medipore H Soft Cloth Surgical Tape, 2x2 (in/yd) (Generic) 1 x Per Day/30 Days Patient Medications ALAMIN, MCCUISTON ([id number]) Allergies:  iodine Notifications Medication Indication Start End Dakin's Solution 05/18/2020 DOSE miscellaneous 0.25 % solution - solution miscellaneous used to moisten the gauze then apply to the wound bed daily as directed in the clinic with dressing changes. Electronic Signature(s) Signed: 05/23/2020 12:55:49 PM By: Georges Mouse, Minus Breeding RN Signed: 05/23/2020 6:19:37 PM By: Worthy Keeler PA-C Previous Signature: 05/18/2020 2:45:45 PM Version By: Worthy Keeler PA-C Entered By: Georges Mouse, Minus Breeding on 05/23/2020 12:55:49 Adam Christian ([id number]) -------------------------------------------------------------------------------- Problem List Details Patient Name: Adam Christian Date of Service: 05/18/2020 1:00 PM Medical Record Number: [id number] Patient Account Number: [id number] Date of Birth/Sex: 1980/05/29 (40 y.o. Male) Treating RN: Dolan Amen Primary Care Provider: Lin Landsman Other Clinician: Jeanine Luz Referring Provider: Erroll Luna Treating Provider/Extender: Skipper Cliche in Treatment: 0 Active Problems ICD-10 Encounter Code Description Active Date MDM Diagnosis L73.2 Hidradenitis suppurativa 05/18/2020 No Yes L98.492 Non-pressure chronic ulcer of skin of other sites with fat layer exposed 05/18/2020 No Yes F33.9 Major depressive disorder, recurrent, unspecified 05/18/2020 No Yes Inactive Problems Resolved Problems Electronic Signature(s) Signed: 05/18/2020 1:59:06 PM By: Worthy Keeler PA-C Entered By: Worthy Keeler on 05/18/2020 13:59:06 Adam Christian  ([id number]) -------------------------------------------------------------------------------- Progress Note Details Patient Name: Adam Christian Date of Service: 05/18/2020 1:00 PM Medical Record Number: [id number] Patient Account Number: [id number] Date of Birth/Sex: 12-01-1980 (40 y.o. Male) Treating RN: Dolan Amen Primary Care Provider: Lin Landsman Other Clinician: Jeanine Luz Referring Provider: Erroll Luna Treating Provider/Extender: Skipper Cliche in Treatment: 0 Subjective Chief Complaint Information obtained from Patient Bilateral gluteal, perineal, and groin ulceration due to hidradenitis History of Present Illness (HPI) 05/18/2020 upon evaluation today patient appears to be doing poorly in regard to his wounds. This is actually a surgical wound at this point he has a longstanding history around 15 years of dealing with hidradenitis. Unfortunately he was getting increasingly tender and having a lot of discomfort subsequently Dr. Wilma Flavin who is the surgeon in [place] did actually take him to surgery to try to clear everything out to try to let this area to heal appropriately. Unfortunately that was not able to be done in 1 time according to what the patient tells me it seems that this is going require serial debridements. With that being said the patient apparently is a smoker and this is still good to be a significant issue here as far as that is concerned. With a skin graft or flap that would obviously help to secure things much more quickly as far as healing is concerned but again he cannot be smoking if that is to be considered. He encouraged the patient that is Dr. Wilma Flavin to stop smoking which would also help ease the progression of the disease as well. The patient has no other major medical problems other than depressive disorder but he does not seem to be having issues with that particularly right now. He tells me he is currently out of pain  medication though the 5 mg oxycodone was helping with dressing changes in the past. Patient History Information obtained from Patient. Allergies iodine (Severity: Severe, Reaction: anaphylactic) Family History Diabetes - Mother, Hypertension - Siblings,Mother, Kidney Disease - Mother,Siblings, No family history of Cancer, Heart Disease, Hereditary Spherocytosis, Lung Disease, Seizures, Stroke, Thyroid Problems, Tuberculosis. Social History Former smoker - ended on 04/06/2020, Marital Status - Single, Alcohol Use - Rarely, Drug Use - Current History - marijuana regularly, Caffeine Use - Rarely. Medical History Integumentary (Skin) Denies history of History of Burn, History of pressure wounds Neurologic  Patient has history of Seizure Disorder - history-last at age 16 Hospitalization/Surgery History - debride Rgroin and Rthigh Rbuttock. - repeat debridments with hidradenitis. - since 2005 dealing with hidradenitis. 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 Review of Systems (ROS) Constitutional Symptoms (General Health) Denies complaints or symptoms of Fatigue, Fever, Chills, Marked Weight Change. Eyes Denies complaints or symptoms of Dry Eyes, Vision Changes, Glasses / Contacts. Ear/Nose/Mouth/Throat Denies complaints or symptoms of Difficult clearing ears, Sinusitis. Hematologic/Lymphatic Denies complaints or symptoms of Bleeding / Clotting Disorders, Human Immunodeficiency Virus. Respiratory Denies complaints or symptoms of Chronic or frequent coughs, Shortness of Breath. Cardiovascular Denies complaints or symptoms of Chest pain, LE edema. Gastrointestinal Denies complaints or symptoms of Frequent diarrhea, Nausea, Vomiting. Endocrine Denies complaints or symptoms of Hepatitis, Thyroid disease, Polydypsia (Excessive Thirst). Genitourinary Denies complaints or symptoms of Kidney failure/ Dialysis, Incontinence/dribbling. Immunological Denies complaints or symptoms of Hives,  Itching. Integumentary (Skin) Complains or has symptoms of Wounds - repeat with Hidradentis, Breakdown - repeat with Hidradenitis, Swelling - yes with hidradenitis. Denies complaints or symptoms of Bleeding or bruising tendency. Adam Christian, Adam Christian ([id number]) Musculoskeletal Denies complaints or symptoms of Muscle Pain, Muscle Weakness. Psychiatric Denies complaints or symptoms of Anxiety, states was diagnosed with MDD but doesn't medicate Objective Constitutional supine blood pressure is within target range for patient.. pulse regular and within target range for patient.Marland Kitchen respirations regular, non-labored and within target range for patient.Marland Kitchen temperature within target range for patient.. Well-nourished and well-hydrated in no acute distress. Vitals Time Taken: 1:20 PM, Height: 66 in, Weight: 151 lbs, Source: Measured, BMI: 24.4, Temperature: 98.7 F, Pulse: 73 bpm, Respiratory Rate: 18 breaths/min, Blood Pressure: 131/79 mmHg. Eyes conjunctiva clear no eyelid edema noted. pupils equal round and reactive to light and accommodation. Ears, Nose, Mouth, and Throat no gross abnormality of ear auricles or external auditory canals. normal hearing noted during conversation. mucus membranes moist. Respiratory normal breathing without difficulty. Musculoskeletal Patient unable to walk without assistance. Psychiatric this patient is able to make decisions and demonstrates good insight into disease process. Alert and Oriented x 3. pleasant and cooperative. General Notes: Upon evaluation today the patient appears to show signs of having significant issues currently with significant wounds in the bilateral gluteal, perineal, and groin area. This unfortunately is a significant surgical ulcer at this point and again does not seem to be healing nearly as well as we would love to see but again probably as good as can be expected considering what is going on. The patient tells me it is better than prior to  the surgery when he had a significant fluid pocket that was causing significant issues. Nonetheless there is still quite a ways to go to see any kind of complete resolution and to be honest he may still need to see a specialist at [facility] such as plastic surgery to try to close this with a flap but that cannot happen while he is smoking. Integumentary (Hair, Skin) Wound #1 status is Open. Original cause of wound was Gradually Appeared. The date acquired was: 06/12/2018. The wound is located on the Right Gluteus. The wound measures 5.7cm length x 4.5cm width x 1cm depth; 20.145cm^2 area and 20.145cm^3 volume. There is Fat Layer (Subcutaneous Tissue) exposed. There is a large amount of serosanguineous drainage noted. There is small (1-33%) red granulation within the wound bed. There is a large (67-100%) amount of necrotic tissue within the wound bed including Adherent Slough. Wound #2 status is Open. Original cause of wound was Gradually Appeared. The  date acquired was: 06/12/2018. The wound is located on the Left Gluteus. The wound measures 23.3cm length x 11cm width x 1.3cm depth; 201.298cm^2 area and 261.687cm^3 volume. There is Fat Layer (Subcutaneous Tissue) exposed. There is no tunneling or undermining noted. There is a large amount of serosanguineous drainage noted. There is small (1-33%) red granulation within the wound bed. There is a large (67-100%) amount of necrotic tissue within the wound bed including Adherent Slough. Assessment Active Problems ICD-10 Hidradenitis suppurativa Non-pressure chronic ulcer of skin of other sites with fat layer exposed Major depressive disorder, recurrent, unspecified Adam Christian, Adam Christian ([id number]) Plan Follow-up Appointments: Return Appointment in 1 week. Bathing/ Shower/ Hygiene: Clean wound with Normal Saline or wound cleanser. The following medication(s) was prescribed: Dakin's Solution miscellaneous 0.25 % solution solution miscellaneous used to  moisten the gauze then apply to the wound bed daily as directed in the clinic with dressing changes. starting 05/18/2020 WOUND #1: - Gluteus Wound Laterality: Right Cleanser: Byram Ancillary Kit - 15 Day Supply (DME) (Generic) 1 x Per Day/30 Days Discharge Instructions: Use supplies as instructed; Kit contains: (15) Saline Bullets; (15) 3x3 Gauze; 15 pr Gloves Secondary Dressing: ABD Pad 5x9 (in/in) (DME) (Generic) 1 x Per Day/30 Days Discharge Instructions: Cover with ABD pad Secondary Dressing: Conforming Guaze Roll-Medium 1 x Per Day/30 Days Discharge Instructions: Moistened with Dakins Solution, pack in wound Secured With: 65M Medipore H Soft Cloth Surgical Tape, 2x2 (in/yd) (DME) (Generic) 1 x Per Day/30 Days WOUND #2: - Gluteus Wound Laterality: Left Cleanser: Byram Ancillary Kit - 15 Day Supply (DME) (Generic) 1 x Per Day/30 Days Discharge Instructions: Use supplies as instructed; Kit contains: (15) Saline Bullets; (15) 3x3 Gauze; 15 pr Gloves Secondary Dressing: ABD Pad 5x9 (in/in) (DME) (Generic) 1 x Per Day/30 Days Discharge Instructions: Cover with ABD pad Secondary Dressing: Conforming Guaze Roll-Medium (DME) (Generic) 1 x Per Day/30 Days Discharge Instructions: Moistened with Dakins Solution, pack in wound Secondary Dressing: Kerlix 4.5 x 4.1 (in/yd) (DME) (Generic) 1 x Per Day/30 Days Discharge Instructions: Apply Kerlix 4.5 x 4.1 (in/yd) as instructed Secured With: 65M Medipore H Soft Cloth Surgical Tape, 2x2 (in/yd) (DME) (Generic) 1 x Per Day/30 Days 1. Would recommend currently that we go ahead and continue with the wound care measures as before as far as the wet-to-dry packing although Minna suggest Dakin's moistened gauze as opposed to just utilizing the saline moistened gauze I think this will help clean up the surface of the wounds better and cut back on bacteria. 2. I am also can recommend that we have the patient go ahead and continue with the offloading obviously he is  doing this pretty much anyway secondary to pain and discomfort. 3. I am also going to suggest that the patient continue to follow-up with Dr. Wilma Flavin also recommended to give them a call to see where things are at as far as referring to a specialist at [facility]. We can also refer him to the hidradenitis clinic at [facility] but again I do not want to duplicate anything that is already being done. We will see patient back for reevaluation in 1 week here in the clinic. If anything worsens or changes patient will contact our office for additional recommendations. Electronic Signature(s) Signed: 05/18/2020 2:46:04 PM By: Worthy Keeler PA-C Entered By: Worthy Keeler on 05/18/2020 14:46:04 Adam Christian, Adam Christian ([id number]) -------------------------------------------------------------------------------- ROS/PFSH Details Patient Name: Adam Christian Date of Service: 05/18/2020 1:00 PM Medical Record Number: [id number] Patient Account Number: [id number] Date of Birth/Sex: 1980-07-09 (39  y.o. Male) Treating RN: Donnamarie Poag Primary Care Provider: Lin Landsman Other Clinician: Jeanine Luz Referring Provider: Erroll Luna Treating Provider/Extender: Skipper Cliche in Treatment: 0 Information Obtained From Patient Constitutional Symptoms (General Health) Complaints and Symptoms: Negative for: Fatigue; Fever; Chills; Marked Weight Change Eyes Complaints and Symptoms: Negative for: Dry Eyes; Vision Changes; Glasses / Contacts Ear/Nose/Mouth/Throat Complaints and Symptoms: Negative for: Difficult clearing ears; Sinusitis Hematologic/Lymphatic Complaints and Symptoms: Negative for: Bleeding / Clotting Disorders; Human Immunodeficiency Virus Respiratory Complaints and Symptoms: Negative for: Chronic or frequent coughs; Shortness of Breath Cardiovascular Complaints and Symptoms: Negative for: Chest pain; LE edema Gastrointestinal Complaints and Symptoms: Negative for: Frequent diarrhea; Nausea;  Vomiting Endocrine Complaints and Symptoms: Negative for: Hepatitis; Thyroid disease; Polydypsia (Excessive Thirst) Genitourinary Complaints and Symptoms: Negative for: Kidney failure/ Dialysis; Incontinence/dribbling Immunological Complaints and Symptoms: Negative for: Hives; Itching Integumentary (Skin) Complaints and Symptoms: Positive for: Wounds - repeat with Hidradentis; Breakdown - repeat with Hidradenitis; Swelling - yes with hidradenitis Negative for: Bleeding or bruising tendency Adam Christian, Adam Christian ([id number]) Medical History: Negative for: History of Burn; History of pressure wounds Musculoskeletal Complaints and Symptoms: Negative for: Muscle Pain; Muscle Weakness Psychiatric Complaints and Symptoms: Negative for: Anxiety Review of System Notes: states was diagnosed with MDD but doesn't medicate Neurologic Medical History: Positive for: Seizure Disorder - history-last at age 89 Oncologic Immunizations Pneumococcal Vaccine: Received Pneumococcal Vaccination: No Implantable Devices None Hospitalization / Surgery History Type of Hospitalization/Surgery debride Rgroin and Rthigh Rbuttock repeat debridments with hidradenitis since 2005 dealing with hidradenitis 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 Family and Social History Cancer: No; Diabetes: Yes - Mother; Heart Disease: No; Hereditary Spherocytosis: No; Hypertension: Yes - Siblings,Mother; Kidney Disease: Yes - Mother,Siblings; Lung Disease: No; Seizures: No; Stroke: No; Thyroid Problems: No; Tuberculosis: No; Former smoker - ended on 04/06/2020; Marital Status - Single; Alcohol Use: Rarely; Drug Use: Current History - marijuana regularly; Caffeine Use: Rarely; Financial Concerns: No; Food, Clothing or Shelter Needs: No; Support System Lacking: No; Transportation Concerns: No Electronic Signature(s) Signed: 05/18/2020 5:09:19 PM By: Donnamarie Poag Signed: 05/18/2020 6:06:53 PM By: Worthy Keeler PA-C Entered By: Donnamarie Poag  on 05/18/2020 13:33:32 Adam Christian, Adam Christian ([id number]) -------------------------------------------------------------------------------- SuperBill Details Patient Name: Adam Christian Date of Service: 05/18/2020 Medical Record Number: [id number] Patient Account Number: [id number] Date of Birth/Sex: Aug 09, 1980 (40 y.o. Male) Treating RN: Dolan Amen Primary Care Provider: Lin Landsman Other Clinician: Jeanine Luz Referring Provider: Erroll Luna Treating Provider/Extender: Skipper Cliche in Treatment: 0 Diagnosis Coding ICD-10 Codes Code Description L73.2 Hidradenitis suppurativa L98.492 Non-pressure chronic ulcer of skin of other sites with fat layer exposed F33.9 Major depressive disorder, recurrent, unspecified Facility Procedures CPT4 Code: [id number] Description: 99213 - WOUND CARE VISIT-LEV 3 EST PT Modifier: Quantity: 1 Physician Procedures CPT4 Code: 8115726 Description: 20355 - WC PHYS LEVEL 4 - NEW PT Modifier: Quantity: 1 CPT4 Code: Description: ICD-10 Diagnosis Description L73.2 Hidradenitis suppurativa 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

## 2020-05-18 NOTE — Progress Notes (Signed)
EULOGIO, REQUENA ([id number]) Visit Report for 05/18/2020 Abuse/Suicide Risk Screen Details Patient Name: Adam Christian, Adam Christian Date of Service: 05/18/2020 1:00 PM Medical Record Number: [id number] Patient Account Number: [id number] Date of Birth/Sex: Jun 27, 1980 (39 y.o. Male) Treating RN: Hansel Feinstein Primary Care Zehra Rucci: Leilani Able Other Clinician: Lolita Cram Referring Jaqwan Wieber: Harriette Bouillon Treating Riddick Nuon/Extender: Rowan Blase in Treatment: 0 Abuse/Suicide Risk Screen Items Answer ABUSE RISK SCREEN: Has anyone close to you tried to hurt or harm you recentlyo No Do you feel uncomfortable with anyone in your familyo No Has anyone forced you do things that you didnot want to doo No Electronic Signature(s) Signed: 05/18/2020 5:09:19 PM By: Hansel Feinstein Entered By: Hansel Feinstein on 05/18/2020 13:33:45 Engram, [name] ([id number]) -------------------------------------------------------------------------------- Activities of Daily Living Details Patient Name: Adam Christian Date of Service: 05/18/2020 1:00 PM Medical Record Number: [id number] Patient Account Number: [id number] Date of Birth/Sex: [birth year] (39 y.o. Male) Treating RN: Hansel Feinstein Primary Care Rakesh Dutko: Leilani Able Other Clinician: Lolita Cram Referring Faatima Tench: Harriette Bouillon Treating Yocheved Depner/Extender: Rowan Blase in Treatment: 0 Activities of Daily Living Items Answer Activities of Daily Living (Please select one for each item) Drive Automobile Completely Able Take Medications Completely Able Use Telephone Completely Able Care for Appearance Completely Able Use Toilet Completely Able Bath / Shower Completely Able Dress Self Completely Able Feed Self Completely Able Walk Completely Able Get In / Out Bed Completely Able Housework Need Assistance Prepare Meals Need Assistance Handle Money Completely Able Shop for Self Need Assistance Electronic Signature(s) Signed: 05/18/2020 5:09:19 PM By:  Hansel Feinstein Entered By: Hansel Feinstein on 05/18/2020 13:34:31 Adam Christian ([id number]) -------------------------------------------------------------------------------- Education Screening Details Patient Name: Adam Christian Date of Service: 05/18/2020 1:00 PM Medical Record Number: [id number] Patient Account Number: [id number] Date of Birth/Sex: [birth year] (39 y.o. Male) Treating RN: Hansel Feinstein Primary Care Kasen Adduci: Leilani Able Other Clinician: Lolita Cram Referring Zuhayr Deeney: Harriette Bouillon Treating Denecia Brunette/Extender: Rowan Blase in Treatment: 0 Primary Learner Assessed: Patient Learning Preferences/Education Level/Primary Language Learning Preference: Explanation Highest Education Level: College or Above Preferred Language: English Cognitive Barrier Language Barrier: No Translator Needed: No Memory Deficit: No Emotional Barrier: No Cultural/Religious Beliefs Affecting Medical Care: No Physical Barrier Impaired Vision: No Impaired Hearing: No Decreased Hand dexterity: No Knowledge/Comprehension Knowledge Level: High Comprehension Level: High Ability to understand written instructions: High Ability to understand verbal instructions: High Motivation Anxiety Level: Calm Cooperation: Cooperative Education Importance: Acknowledges Need Interest in Health Problems: Asks Questions Perception: Coherent Willingness to Engage in Self-Management High Activities: Readiness to Engage in Self-Management High Activities: Electronic Signature(s) Signed: 05/18/2020 5:09:19 PM By: Hansel Feinstein Entered By: Hansel Feinstein on 05/18/2020 13:35:01 Adam Christian ([id number]) -------------------------------------------------------------------------------- Fall Risk Assessment Details Patient Name: Adam Christian Date of Service: 05/18/2020 1:00 PM Medical Record Number: [id number] Patient Account Number: [id number] Date of Birth/Sex: 04-25-1980 (39 y.o. Male) Treating RN:  Hansel Feinstein Primary Care Jaycion Treml: Leilani Able Other Clinician: Lolita Cram Referring Sherlyne Crownover: Harriette Bouillon Treating Roshni Burbano/Extender: Rowan Blase in Treatment: 0 Fall Risk Assessment Items Have you had 2 or more falls in the last 12 monthso 0 No Have you had any fall that resulted in injury in the last 12 monthso 0 No FALLS RISK SCREEN History of falling - immediate or within 3 months 0 No Secondary diagnosis (Do you have 2 or more medical diagnoseso) 0 No Ambulatory aid None/bed rest/wheelchair/nurse 0 No Crutches/cane/walker 15 Yes Furniture 0 No Intravenous therapy Access/Saline/Heparin Lock 0 No Gait/Transferring Normal/ bed rest/ wheelchair 0 No Weak (short steps with or without shuffle,  stooped but able to lift head while walking, may 10 Yes seek support from furniture) Impaired (short steps with shuffle, may have difficulty arising from chair, head down, impaired 0 No balance) Mental Status Oriented to own ability 0 Yes Electronic Signature(s) Signed: 05/18/2020 5:09:19 PM By: Hansel Feinstein Entered By: Hansel Feinstein on 05/18/2020 13:35:26 Wainer, [name] ([id number]) -------------------------------------------------------------------------------- Foot Assessment Details Patient Name: Adam Christian Date of Service: 05/18/2020 1:00 PM Medical Record Number: [id number] Patient Account Number: [id number] Date of Birth/Sex: 06/27/1980 (39 y.o. Male) Treating RN: Hansel Feinstein Primary Care Aubreigh Fuerte: Leilani Able Other Clinician: Lolita Cram Referring Hillary Schwegler: Harriette Bouillon Treating Shenae Bonanno/Extender: Rowan Blase in Treatment: 0 Foot Assessment Items Site Locations + = Sensation present, - = Sensation absent, C = Callus, U = Ulcer R = Redness, W = Warmth, M = Maceration, PU = Pre-ulcerative lesion F = Fissure, S = Swelling, D = Dryness Assessment Right: Left: Other Deformity: No No Prior Foot Ulcer: No No Prior Amputation: No No Charcot Joint:  No No Ambulatory Status: Ambulatory Without Help Gait: Steady Electronic Signature(s) Signed: 05/18/2020 5:09:19 PM By: Hansel Feinstein Entered By: Hansel Feinstein on 05/18/2020 13:37:46 Hillock, [name] ([id number]) -------------------------------------------------------------------------------- Nutrition Risk Screening Details Patient Name: Adam Christian Date of Service: 05/18/2020 1:00 PM Medical Record Number: [id number] Patient Account Number: [id number] Date of Birth/Sex: [birth year] (39 y.o. Male) Treating RN: Hansel Feinstein Primary Care Jacier Gladu: Leilani Able Other Clinician: Lolita Cram Referring Javyon Fontan: Harriette Bouillon Treating Courtnay Petrilla/Extender: Rowan Blase in Treatment: 0 Height (in): 66 Weight (lbs): 151 Body Mass Index (BMI): 24.4 Nutrition Risk Screening Items Score Screening NUTRITION RISK SCREEN: I have an illness or condition that made me change the kind and/or amount of food I eat 2 Yes I eat fewer than two meals per day 0 No I eat few fruits and vegetables, or milk products 2 Yes I have three or more drinks of beer, liquor or wine almost every day 0 No I have tooth or mouth problems that make it hard for me to eat 0 No I don't always have enough money to buy the food I need 0 No I eat alone most of the time 1 Yes I take three or more different prescribed or over-the-counter drugs a day 0 No Without wanting to, I have lost or gained 10 pounds in the last six months 2 Yes I am not always physically able to shop, cook and/or feed myself 0 No Nutrition Protocols Good Risk Protocol Moderate Risk Protocol High Risk Proctocol Risk Level: High Risk Score: 7 Electronic Signature(s) Signed: 05/18/2020 5:09:19 PM By: Hansel Feinstein Entered ByHansel Feinstein on 05/18/2020 13:36:31

## 2020-05-22 ENCOUNTER — Encounter (HOSPITAL_COMMUNITY): Payer: Self-pay | Admitting: Surgery

## 2020-05-26 ENCOUNTER — Other Ambulatory Visit: Payer: Self-pay

## 2020-05-26 ENCOUNTER — Encounter: Payer: BC Managed Care – PPO | Admitting: Physician Assistant

## 2020-05-26 DIAGNOSIS — L732 Hidradenitis suppurativa: Secondary | ICD-10-CM | POA: Diagnosis not present

## 2020-05-26 NOTE — Progress Notes (Addendum)
QUARTEZ, LAGOS ([id number]) Visit Report for 05/26/2020 Chief Complaint Document Details Patient Name: Adam, Christian Date of Service: 05/26/2020 3:45 PM Medical Record Number: [id number] Patient Account Number: [id number] Date of Birth/Sex: 06-09-1980 (39 y.o. M) Treating RN: Carlene Coria Primary Care Provider: Lin Landsman Other Clinician: Jeanine Luz Referring Provider: Lin Landsman Treating Provider/Extender: Skipper Cliche in Treatment: 1 Information Obtained from: Patient Chief Complaint Bilateral gluteal, perineal, and groin ulceration due to hidradenitis Electronic Signature(s) Signed: 05/26/2020 4:14:04 PM By: Worthy Keeler PA-C Entered By: Worthy Keeler on 05/26/2020 16:14:04 Adam Christian ([id number]) -------------------------------------------------------------------------------- HPI Details Patient Name: Adam Christian Date of Service: 05/26/2020 3:45 PM Medical Record Number: [id number] Patient Account Number: [id number] Date of Birth/Sex: December 19, 1980 (39 y.o. M) Treating RN: Carlene Coria Primary Care Provider: Lin Landsman Other Clinician: Jeanine Luz Referring Provider: Lin Landsman Treating Provider/Extender: Skipper Cliche in Treatment: 1 History of Present Illness HPI Description: 05/18/2020 upon evaluation today patient appears to be doing poorly in regard to his wounds. This is actually a surgical wound at this point he has a longstanding history around 15 years of dealing with hidradenitis. Unfortunately he was getting increasingly tender and having a lot of discomfort subsequently Dr. Wilma Flavin who is the surgeon in [place] did actually take him to surgery to try to clear everything out to try to let this area to heal appropriately. Unfortunately that was not able to be done in 1 time according to what the patient tells me it seems that this is going require serial debridements. With that being said the patient apparently is a  smoker and this is still good to be a significant issue here as far as that is concerned. With a skin graft or flap that would obviously help to secure things much more quickly as far as healing is concerned but again he cannot be smoking if that is to be considered. He encouraged the patient that is Dr. Wilma Flavin to stop smoking which would also help ease the progression of the disease as well. The patient has no other major medical problems other than depressive disorder but he does not seem to be having issues with that particularly right now. He tells me he is currently out of pain medication though the 5 mg oxycodone was helping with dressing changes in the past. 05/26/2020 on evaluation today patient appears to be doing well with regard to his wounds. Honestly he is making excellent progress all things considered. These are significant wounds and I believe the Dakin's is helping to clean things up he also tells me is not having as much discomfort as it was previous. Fortunately there is no evidence of active infection at this time. Electronic Signature(s) Signed: 05/26/2020 5:05:56 PM By: Worthy Keeler PA-C Entered By: Worthy Keeler on 05/26/2020 17:05:55 JENCARLOS, NICOLSON ([id number]) -------------------------------------------------------------------------------- Physical Exam Details Patient Name: Adam Christian Date of Service: 05/26/2020 3:45 PM Medical Record Number: [id number] Patient Account Number: [id number] Date of Birth/Sex: 10/26/1980 (39 y.o. M) Treating RN: Carlene Coria Primary Care Provider: Lin Landsman Other Clinician: Jeanine Luz Referring Provider: Lin Landsman Treating Provider/Extender: Skipper Cliche in Treatment: 1 Constitutional Well-nourished and well-hydrated in no acute distress. Respiratory normal breathing without difficulty. Psychiatric this patient is able to make decisions and demonstrates good insight into disease process. Alert and Oriented x  3. pleasant and cooperative. Notes Upon inspection patient's wound bed actually showed signs of good granulation epithelization at this point. Fortunately there does not appear to be any evidence  of worsening all in all and I think that the patient is making good progress. This still is quite significant as far as the wounds are concerned but nonetheless he at least is headed in the right direction. Electronic Signature(s) Signed: 05/26/2020 5:06:26 PM By: Worthy Keeler PA-C Entered By: Worthy Keeler on 05/26/2020 17:06:26 BETTIE, SWAVELY ([id number]) -------------------------------------------------------------------------------- Physician Orders Details Patient Name: Adam Christian Date of Service: 05/26/2020 3:45 PM Medical Record Number: [id number] Patient Account Number: [id number] Date of Birth/Sex: 07-Aug-1980 (39 y.o. M) Treating RN: Carlene Coria Primary Care Provider: Lin Landsman Other Clinician: Jeanine Luz Referring Provider: Lin Landsman Treating Provider/Extender: Skipper Cliche in Treatment: 1 Verbal / Phone Orders: No Diagnosis Coding ICD-10 Coding Code Description L73.2 Hidradenitis suppurativa L98.492 Non-pressure chronic ulcer of skin of other sites with fat layer exposed F33.9 Major depressive disorder, recurrent, unspecified Follow-up Appointments o Return Appointment in 1 week. [place]: [facility] o ADMIT to [place] for wound care. May utilize formulary equivalent dressing for wound treatment orders unless otherwise specified. Home Health Nurse may visit PRN to address patientos wound care needs. - 3 times a week to assisted with dressing changes o Scheduled days for dressing changes to be completed; exception, patient has scheduled wound care visit that day. o **Please direct any NON-WOUND related issues/requests for orders to patient's Primary Care Physician. **If current dressing causes regression in wound  condition, may D/C ordered dressing product/s and apply Normal Saline Moist Dressing daily until next [place] or Other MD appointment. **Notify [facility] of regression in wound condition at [phone]. Bathing/ Shower/ Hygiene o Clean wound with Normal Saline or wound cleanser. Wound Treatment Wound #1 - Gluteus Wound Laterality: Right Cleanser: Byram Ancillary Kit - 15 Day Supply (Generic) 1 x Per Day/30 Days Discharge Instructions: Use supplies as instructed; Kit contains: (15) Saline Bullets; (15) 3x3 Gauze; 15 pr Gloves Secondary Dressing: ABD Pad 5x9 (in/in) (Generic) 1 x Per Day/30 Days Discharge Instructions: Cover with ABD pad Secondary Dressing: Conforming Guaze Roll-Medium 1 x Per Day/30 Days Discharge Instructions: Moistened with Dakins Solution, pack in wound Secured With: 75M Medipore H Soft Cloth Surgical Tape, 2x2 (in/yd) (Generic) 1 x Per Day/30 Days Wound #2 - Gluteus Wound Laterality: Left Cleanser: Byram Ancillary Kit - 15 Day Supply (Generic) 1 x Per Day/30 Days Discharge Instructions: Use supplies as instructed; Kit contains: (15) Saline Bullets; (15) 3x3 Gauze; 15 pr Gloves Secondary Dressing: ABD Pad 5x9 (in/in) (Generic) 1 x Per Day/30 Days Discharge Instructions: Cover with ABD pad Secondary Dressing: Conforming Guaze Roll-Medium (Generic) 1 x Per Day/30 Days Discharge Instructions: Moistened with Dakins Solution, pack in wound Secondary Dressing: Kerlix 4.5 x 4.1 (in/yd) (Generic) 1 x Per Day/30 Days Discharge Instructions: Apply Kerlix 4.5 x 4.1 (in/yd) as instructed Secured With: 75M Medipore H Soft Cloth Surgical Tape, 2x2 (in/yd) (Generic) 1 x Per Day/30 Days HUXTON, GLAUS ([id number]) Electronic Signature(s) Signed: 05/26/2020 5:20:58 PM By: Worthy Keeler PA-C Signed: 05/29/2020 7:55:11 AM By: Carlene Coria RN Previous Signature: 05/26/2020 4:49:43 PM Version By: Jeanine Luz Entered By: Carlene Coria on 05/26/2020  16:54:26 Adam Christian ([id number]) -------------------------------------------------------------------------------- Problem List Details Patient Name: Adam Christian Date of Service: 05/26/2020 3:45 PM Medical Record Number: [id number] Patient Account Number: [id number] Date of Birth/Sex: 20-Apr-1980 (40 y.o. M) Treating RN: Carlene Coria Primary Care Provider: Lin Landsman Other Clinician: Jeanine Luz Referring Provider: Lin Landsman Treating Provider/Extender: Skipper Cliche in Treatment: 1 Active Problems ICD-10  Encounter Code Description Active Date MDM Diagnosis L73.2 Hidradenitis suppurativa 05/18/2020 No Yes L98.492 Non-pressure chronic ulcer of skin of other sites with fat layer exposed 05/18/2020 No Yes F33.9 Major depressive disorder, recurrent, unspecified 05/18/2020 No Yes Inactive Problems Resolved Problems Electronic Signature(s) Signed: 05/26/2020 4:13:58 PM By: Worthy Keeler PA-C Entered By: Worthy Keeler on 05/26/2020 16:13:58 Adam Christian ([id number]) -------------------------------------------------------------------------------- Progress Note Details Patient Name: Adam Christian Date of Service: 05/26/2020 3:45 PM Medical Record Number: [id number] Patient Account Number: [id number] Date of Birth/Sex: May 12, 1980 (39 y.o. M) Treating RN: Carlene Coria Primary Care Provider: Lin Landsman Other Clinician: Jeanine Luz Referring Provider: Lin Landsman Treating Provider/Extender: Skipper Cliche in Treatment: 1 Subjective Chief Complaint Information obtained from Patient Bilateral gluteal, perineal, and groin ulceration due to hidradenitis History of Present Illness (HPI) 05/18/2020 upon evaluation today patient appears to be doing poorly in regard to his wounds. This is actually a surgical wound at this point he has a longstanding history around 15 years of dealing with hidradenitis. Unfortunately he was getting increasingly tender and having a lot  of discomfort subsequently Dr. Wilma Flavin who is the surgeon in [place] did actually take him to surgery to try to clear everything out to try to let this area to heal appropriately. Unfortunately that was not able to be done in 1 time according to what the patient tells me it seems that this is going require serial debridements. With that being said the patient apparently is a smoker and this is still good to be a significant issue here as far as that is concerned. With a skin graft or flap that would obviously help to secure things much more quickly as far as healing is concerned but again he cannot be smoking if that is to be considered. He encouraged the patient that is Dr. Wilma Flavin to stop smoking which would also help ease the progression of the disease as well. The patient has no other major medical problems other than depressive disorder but he does not seem to be having issues with that particularly right now. He tells me he is currently out of pain medication though the 5 mg oxycodone was helping with dressing changes in the past. 05/26/2020 on evaluation today patient appears to be doing well with regard to his wounds. Honestly he is making excellent progress all things considered. These are significant wounds and I believe the Dakin's is helping to clean things up he also tells me is not having as much discomfort as it was previous. Fortunately there is no evidence of active infection at this time. Objective Constitutional Well-nourished and well-hydrated in no acute distress. Vitals Time Taken: 4:14 PM, Height: 66 in, Weight: 151 lbs, BMI: 24.4, Temperature: 99.9 F, Pulse: 98 bpm, Respiratory Rate: 18 breaths/min, Blood Pressure: 127/79 mmHg. Respiratory normal breathing without difficulty. Psychiatric this patient is able to make decisions and demonstrates good insight into disease process. Alert and Oriented x 3. pleasant and cooperative. General Notes: Upon  inspection patient's wound bed actually showed signs of good granulation epithelization at this point. Fortunately there does not appear to be any evidence of worsening all in all and I think that the patient is making good progress. This still is quite significant as far as the wounds are concerned but nonetheless he at least is headed in the right direction. Integumentary (Hair, Skin) Wound #1 status is Open. Original cause of wound was Surgical Injury. The date acquired was: 05/09/2020. The wound has been in treatment 1  weeks. The wound is located on the Right Gluteus. The wound measures 5.6cm length x 5cm width x 0.6cm depth; 21.991cm^2 area and 13.195cm^3 volume. There is Fat Layer (Subcutaneous Tissue) exposed. There is no tunneling or undermining noted. There is a large amount of serosanguineous drainage noted. There is large (67-100%) red granulation within the wound bed. There is a small (1-33%) amount of necrotic tissue within the wound bed including Adherent Slough. Wound #2 status is Open. Original cause of wound was Surgical Injury. The date acquired was: 05/09/2020. The wound has been in treatment 1 weeks. The wound is located on the Left Gluteus. The wound measures 23cm length x 6cm width x 1cm depth; 108.385cm^2 area and 108.385cm^3 volume. There is Fat Layer (Subcutaneous Tissue) exposed. There is no tunneling noted, however, there is undermining starting at 5:00 and ending at 5:00 with a maximum distance of 1cm. There is a large amount of serosanguineous drainage noted. There is small (1-33%) red granulation within the wound bed. There is a large (67-100%) amount of necrotic tissue within the wound bed including Adherent Slough. SAHAN, PEN ([id number]) Assessment Active Problems ICD-10 Hidradenitis suppurativa Non-pressure chronic ulcer of skin of other sites with fat layer exposed Major depressive disorder, recurrent, unspecified Plan Follow-up Appointments: Return  Appointment in 1 week. Home Health: [place]: [facility] to [place] for wound care. May utilize formulary equivalent dressing for wound treatment orders unless otherwise specified. Home Health Nurse may visit PRN to address patient s wound care needs. - 3 times a week to assisted with dressing changes Scheduled days for dressing changes to be completed; exception, patient has scheduled wound care visit that day. **Please direct any NON-WOUND related issues/requests for orders to patient's Primary Care Physician. **If current dressing causes regression in wound condition, may D/C ordered dressing product/s and apply Normal Saline Moist Dressing daily until next [place] or Other MD appointment. **Notify [facility] of regression in wound condition at [phone]. Bathing/ Shower/ Hygiene: Clean wound with Normal Saline or wound cleanser. WOUND #1: - Gluteus Wound Laterality: Right Cleanser: Byram Ancillary Kit - 15 Day Supply (Generic) 1 x Per Day/30 Days Discharge Instructions: Use supplies as instructed; Kit contains: (15) Saline Bullets; (15) 3x3 Gauze; 15 pr Gloves Secondary Dressing: ABD Pad 5x9 (in/in) (Generic) 1 x Per Day/30 Days Discharge Instructions: Cover with ABD pad Secondary Dressing: Conforming Guaze Roll-Medium 1 x Per Day/30 Days Discharge Instructions: Moistened with Dakins Solution, pack in wound Secured With: 69M Medipore H Soft Cloth Surgical Tape, 2x2 (in/yd) (Generic) 1 x Per Day/30 Days WOUND #2: - Gluteus Wound Laterality: Left Cleanser: Byram Ancillary Kit - 15 Day Supply (Generic) 1 x Per Day/30 Days Discharge Instructions: Use supplies as instructed; Kit contains: (15) Saline Bullets; (15) 3x3 Gauze; 15 pr Gloves Secondary Dressing: ABD Pad 5x9 (in/in) (Generic) 1 x Per Day/30 Days Discharge Instructions: Cover with ABD pad Secondary Dressing: Conforming Guaze Roll-Medium (Generic) 1 x Per Day/30 Days Discharge  Instructions: Moistened with Dakins Solution, pack in wound Secondary Dressing: Kerlix 4.5 x 4.1 (in/yd) (Generic) 1 x Per Day/30 Days Discharge Instructions: Apply Kerlix 4.5 x 4.1 (in/yd) as instructed Secured With: 69M Medipore H Soft Cloth Surgical Tape, 2x2 (in/yd) (Generic) 1 x Per Day/30 Days 1. Would recommend currently that we going continue with the Dakin's moistened gauze at this point. I think that still the best way to go and the patient is in agreement with that plan. 2. Patient is continuing  to clean the area well and he seems to be doing an excellent job he is also able to walk a little bit he still having a lot of pain but to be honest he is doing amazing in my opinion. We will see patient back for reevaluation in 1 week here in the clinic. If anything worsens or changes patient will contact our office for additional recommendations. Electronic Signature(s) Signed: 05/26/2020 5:12:41 PM By: Worthy Keeler PA-C Entered By: Worthy Keeler on 05/26/2020 17:12:41 Adam Christian ([id number]) -------------------------------------------------------------------------------- SuperBill Details Patient Name: Adam Christian Date of Service: 05/26/2020 Medical Record Number: [id number] Patient Account Number: [id number] Date of Birth/Sex: 12-May-1980 (39 y.o. M) Treating RN: Carlene Coria Primary Care Provider: Lin Landsman Other Clinician: Jeanine Luz Referring Provider: Lin Landsman Treating Provider/Extender: Skipper Cliche in Treatment: 1 Diagnosis Coding ICD-10 Codes Code Description L73.2 Hidradenitis suppurativa L98.492 Non-pressure chronic ulcer of skin of other sites with fat layer exposed F33.9 Major depressive disorder, recurrent, unspecified Facility Procedures CPT4 Code: [id number] Description: 99214 - WOUND CARE VISIT-LEV 4 EST PT Modifier: Quantity: 1 Physician Procedures CPT4 Code: [id number] Description: 70263 - WC PHYS LEVEL 3 - EST  PT Modifier: Quantity: 1 CPT4 Code: Description: ICD-10 Diagnosis Description L73.2 Hidradenitis suppurativa L98.492 Non-pressure chronic ulcer of skin of other sites with fat layer expos F33.9 Major depressive disorder, recurrent, unspecified Modifier: ed Quantity: Electronic Signature(s) Signed: 05/26/2020 5:12:50 PM By: Worthy Keeler PA-C Entered By: Worthy Keeler on 05/26/2020 17:12:50

## 2020-05-29 NOTE — Progress Notes (Signed)
ZYQUAN, CROTTY ([id number]) Visit Report for 05/26/2020 Arrival Information Details Patient Name: Adam Christian, Adam Christian Date of Service: 05/26/2020 3:45 PM Medical Record Number: [id number] Patient Account Number: [id number] Date of Birth/Sex: 1980/11/13 (40 y.o. M) Treating RN: Carlene Coria Primary Care Ernestine Langworthy: Lin Landsman Other Clinician: Jeanine Luz Referring Avanell Banwart: Lin Landsman Treating Shiree Altemus/Extender: Skipper Cliche in Treatment: 1 Visit Information History Since Last Visit Added or deleted any medications: No Patient Arrived: Adam Christian Had a fall or experienced change in No Arrival Time: 16:13 activities of daily living that may affect Accompanied By: self risk of falls: Transfer Assistance: None Hospitalized since last visit: No Patient Identification Verified: Yes Pain Present Now: Yes Secondary Verification Process Completed: Yes Electronic Signature(s) Signed: 05/26/2020 4:49:43 PM By: Jeanine Luz Entered By: Jeanine Luz on 05/26/2020 16:13:58 Adam Christian ([id number]) -------------------------------------------------------------------------------- Clinic Level of Care Assessment Details Patient Name: Adam Christian Date of Service: 05/26/2020 3:45 PM Medical Record Number: [id number] Patient Account Number: [id number] Date of Birth/Sex: [birth year] (39 y.o. M) Treating RN: Carlene Coria Primary Care Kaydie Petsch: Lin Landsman Other Clinician: Jeanine Luz Referring Ariel Dimitri: Lin Landsman Treating Gaynor Genco/Extender: Skipper Cliche in Treatment: 1 Clinic Level of Care Assessment Items TOOL 4 Quantity Score X - Use when only an EandM is performed on FOLLOW-UP visit 1 0 ASSESSMENTS - Nursing Assessment / Reassessment X - Reassessment of Co-morbidities (includes updates in patient status) 1 10 X- 1 5 Reassessment of Adherence to Treatment Plan ASSESSMENTS - Wound and Skin Assessment / Reassessment _0  - Simple Wound Assessment / Reassessment - one  wound 0 X- 2 5 Complex Wound Assessment / Reassessment - multiple wounds _1  - 0 Dermatologic / Skin Assessment (not related to wound area) ASSESSMENTS - Focused Assessment _2  - Circumferential Edema Measurements - multi extremities 0 _3  - 0 Nutritional Assessment / Counseling / Intervention _4  - 0 Lower Extremity Assessment (monofilament, tuning fork, pulses) _5  - 0 Peripheral Arterial Disease Assessment (using hand held doppler) ASSESSMENTS - Ostomy and/or Continence Assessment and Care _6  - Incontinence Assessment and Management 0 _7  - 0 Ostomy Care Assessment and Management (repouching, etc.) PROCESS - Coordination of Care X - Simple Patient / Family Education for ongoing care 1 15 _8  - 0 Complex (extensive) Patient / Family Education for ongoing care _9  - 0 Staff obtains Programmer, systems, Records, Test Results / Process Orders _10  - 0 Staff telephones HHA, Nursing Homes / Clarify orders / etc _11  - 0 Routine Transfer to another Facility (non-emergent condition) _12  - 0 Routine Hospital Admission (non-emergent condition) _13  - 0 New Admissions / Biomedical engineer / Ordering NPWT, Apligraf, etc. _14  - 0 Emergency Hospital Admission (emergent condition) X- 1 10 Simple Discharge Coordination _15  - 0 Complex (extensive) Discharge Coordination PROCESS - Special Needs _16  - Pediatric / Minor Patient Management 0 _17  - 0 Isolation Patient Management _18  - 0 Hearing / Language / Visual special needs _19  - 0 Assessment of Community assistance (transportation, D/C planning, etc.) _20  - 0 Additional assistance / Altered mentation _21  - 0 Support Surface(s) Assessment (bed, cushion, seat, etc.) INTERVENTIONS - Wound Cleansing / Measurement Adam Christian, Adam Christian ([id number]) _22  - 0 Simple Wound Cleansing - one wound X- 2 5 Complex Wound Cleansing - multiple wounds _23  - 0 Wound Imaging (photographs - any number of wounds) _24  - 0 Wound Tracing (instead of photographs) _25  - 0 Simple Wound  Measurement - one wound X- 2 5 Complex Wound Measurement - multiple wounds INTERVENTIONS - Wound Dressings _26  - Small Wound Dressing one or multiple wounds 0 _27  -  0 Medium Wound Dressing one or multiple wounds X- 2 20 Large Wound Dressing one or multiple wounds X- 1 5 Application of Medications - topical <[id number]>_3<\/[id number]>_6  - 0 Application of Medications - injection INTERVENTIONS - Miscellaneous _1  - External ear exam 0 _2  - 0 Specimen Collection (cultures, biopsies, blood, body fluids, etc.) _3  - 0 Specimen(s) / Culture(s) sent or taken to Lab for analysis _4  - 0 Patient Transfer (multiple staff / Harrel Lemon Lift / Similar devices) _5  - 0 Simple Staple / Suture removal (25 or less) _6  - 0 Complex Staple / Suture removal (26 or more) _7  - 0 Hypo / Hyperglycemic Management (close monitor of Blood Glucose) _8  - 0 Ankle / Brachial Index (ABI) - do not check if billed separately X- 1 5 Vital Signs Has the patient been seen at the hospital within the last three years: Yes Total Score: 120 Level Of Care: New/Established - Level 4 Electronic Signature(s) Signed: 05/29/2020 7:55:11 AM By: Carlene Coria RN Entered By: Carlene Coria on 05/26/2020 16:57:11 Adam Christian ([id number]) -------------------------------------------------------------------------------- Encounter Discharge Information Details Patient Name: Adam Christian Date of Service: 05/26/2020 3:45 PM Medical Record Number: [id number] Patient Account Number: [id number] Date of Birth/Sex: [birth year] (39 y.o. M) Treating RN: Donnamarie Poag Primary Care Takisha Pelle: Lin Landsman Other Clinician: Jeanine Luz Referring Yunior Jain: Lin Landsman Treating Erendira Crabtree/Extender: Skipper Cliche in Treatment: 1 Encounter Discharge Information Items Discharge Condition: Stable Ambulatory Status: Cane Discharge Destination: Home Transportation: Private Auto Accompanied By: friend Schedule Follow-up Appointment: Yes Clinical Summary of  Care: Electronic Signature(s) Signed: 05/26/2020 5:01:39 PM By: Donnamarie Poag Entered By: Donnamarie Poag on 05/26/2020 17:01:38 Adam Christian ([id number]) -------------------------------------------------------------------------------- Lower Extremity Assessment Details Patient Name: Adam Christian Date of Service: 05/26/2020 3:45 PM Medical Record Number: [id number] Patient Account Number: [id number] Date of Birth/Sex: 1980/06/01 (39 y.o. M) Treating RN: Carlene Coria Primary Care Rakiya Krawczyk: Lin Landsman Other Clinician: Jeanine Luz Referring Deionna Marcantonio: Lin Landsman Treating Hagan Maltz/Extender: Jeri Cos Weeks in Treatment: 1 Electronic Signature(s) Signed: 05/26/2020 4:49:43 PM By: Jeanine Luz Signed: 05/29/2020 7:55:11 AM By: Carlene Coria RN Entered By: Jeanine Luz on 05/26/2020 16:30:37 Adam Christian, Adam Christian ([id number]) -------------------------------------------------------------------------------- Multi Wound Chart Details Patient Name: Adam Christian Date of Service: 05/26/2020 3:45 PM Medical Record Number: [id number] Patient Account Number: [id number] Date of Birth/Sex: [birth year] (40 y.o. M) Treating RN: Carlene Coria Primary Care Derold Dorsch: Lin Landsman Other Clinician: Jeanine Luz Referring Torrin Crihfield: Lin Landsman Treating Rodney Yera/Extender: Skipper Cliche in Treatment: 1 Vital Signs Height(in): 66 Pulse(bpm): 98 Weight(lbs): 151 Blood Pressure(mmHg): 127/79 Body Mass Index(BMI): 24 Temperature(F): 99.9 Respiratory Rate(breaths/min): 18 Photos: [N/A:N/A] Wound Location: Right Gluteus Left Gluteus N/A Wounding Event: Surgical Injury Surgical Injury N/A Primary Etiology: Open Surgical Wound Open Surgical Wound N/A Secondary Etiology: Hidradenitis Hidradenitis N/A Comorbid History: Seizure Disorder Seizure Disorder N/A Date Acquired: 05/09/2020 05/09/2020 N/A Weeks of Treatment: 1 1 N/A Wound Status: Open Open N/A Measurements L x W x D (cm) 5.6x5x0.6  23x6x1 N/A Area (cm) : 21.991 108.385 N/A Volume (cm) : 13.195 108.385 N/A % Reduction in Area: -9.20% 46.20% N/A % Reduction in Volume: 34.50% 58.60% N/A Starting Position 1 (o'clock): 5 Ending Position 1 (o'clock): 5 Maximum Distance 1 (cm): 1 Undermining: No Yes N/A Classification: Full Thickness Without Exposed Full Thickness Without Exposed N/A Support Structures Support Structures Exudate Amount: Large Large N/A Exudate Type: Serosanguineous Serosanguineous N/A Exudate Color: red, brown red, brown N/A Granulation Amount: Large (67-100%) Small (1-33%) N/A Granulation Quality: Red Red N/A Necrotic Amount: Small (1-33%) Large (67-100%) N/A Exposed Structures: Fat Layer (  Subcutaneous Tissue): Fat Layer (Subcutaneous Tissue): N/A Yes Yes Fascia: No Fascia: No Tendon: No Tendon: No Muscle: No Muscle: No Joint: No Joint: No Bone: No Bone: No Epithelialization: None None N/A Treatment Notes Electronic Signature(s) Signed: 05/26/2020 4:49:43 PM By: Jeanine Luz Entered By: Jeanine Luz on 05/26/2020 16:45:11 Adam Christian ([id number]) Adam Christian, Adam Christian ([id number]) -------------------------------------------------------------------------------- Multi-Disciplinary Care Plan Details Patient Name: Adam Christian Date of Service: 05/26/2020 3:45 PM Medical Record Number: [id number] Patient Account Number: [id number] Date of Birth/Sex: April 05, 1980 (40 y.o. M) Treating RN: Carlene Coria Primary Care Vylet Maffia: Lin Landsman Other Clinician: Jeanine Luz Referring Kimble Hitchens: Lin Landsman Treating Terel Bann/Extender: Skipper Cliche in Treatment: 1 Active Inactive Wound/Skin Impairment Nursing Diagnoses: Impaired tissue integrity Goals: Patient/caregiver will verbalize understanding of skin care regimen Date Initiated: 05/18/2020 Target Resolution Date: 06/17/2020 Goal Status: Active Ulcer/skin breakdown will have a volume reduction of 30% by week 4 Date Initiated:  05/18/2020 Target Resolution Date: 06/17/2020 Goal Status: Active Ulcer/skin breakdown will have a volume reduction of 50% by week 8 Date Initiated: 05/18/2020 Target Resolution Date: 07/18/2020 Goal Status: Active Ulcer/skin breakdown will have a volume reduction of 80% by week 12 Date Initiated: 05/18/2020 Target Resolution Date: 08/17/2020 Goal Status: Active Ulcer/skin breakdown will heal within 14 weeks Date Initiated: 05/18/2020 Target Resolution Date: 09/17/2020 Goal Status: Active Interventions: Assess patient/caregiver ability to obtain necessary supplies Assess patient/caregiver ability to perform ulcer/skin care regimen upon admission and as needed Assess ulceration(s) every visit Provide education on ulcer and skin care Treatment Activities: Referred to DME Tabias Swayze for dressing supplies : 05/18/2020 Skin care regimen initiated : 05/18/2020 Notes: Electronic Signature(s) Signed: 05/29/2020 7:55:11 AM By: Carlene Coria RN Previous Signature: 05/26/2020 4:49:43 PM Version By: Jeanine Luz Entered By: Carlene Coria on 05/26/2020 16:58:17 Adam Christian ([id number]) -------------------------------------------------------------------------------- Pain Assessment Details Patient Name: Adam Christian Date of Service: 05/26/2020 3:45 PM Medical Record Number: [id number] Patient Account Number: [id number] Date of Birth/Sex: 08/10/1980 (40 y.o. M) Treating RN: Carlene Coria Primary Care Diron Haddon: Lin Landsman Other Clinician: Jeanine Luz Referring Siedah Sedor: Lin Landsman Treating Kimorah Ridolfi/Extender: Skipper Cliche in Treatment: 1 Active Problems Location of Pain Severity and Description of Pain Patient Has Paino Yes Site Locations Rate the pain. Current Pain Level: 6 Pain Management and Medication Current Pain Management: Electronic Signature(s) Signed: 05/26/2020 4:49:43 PM By: Jeanine Luz Signed: 05/29/2020 7:55:11 AM By: Carlene Coria RN Entered By: Jeanine Luz on  05/26/2020 16:16:40 Adam Christian ([id number]) -------------------------------------------------------------------------------- Patient/Caregiver Education Details Patient Name: Adam Christian Date of Service: 05/26/2020 3:45 PM Medical Record Number: [id number] Patient Account Number: [id number] Date of Birth/Gender: Jun 29, 1980 (39 y.o. M) Treating RN: Carlene Coria Primary Care Physician: Lin Landsman Other Clinician: Jeanine Luz Referring Physician: Lin Landsman Treating Physician/Extender: Skipper Cliche in Treatment: 1 Education Assessment Education Provided To: Patient Education Topics Provided Wound/Skin Impairment: Methods: Explain/Verbal Responses: State content correctly Electronic Signature(s) Signed: 05/29/2020 7:55:11 AM By: Carlene Coria RN Entered By: Carlene Coria on 05/26/2020 16:57:33 Adam Christian ([id number]) -------------------------------------------------------------------------------- Wound Assessment Details Patient Name: Adam Christian Date of Service: 05/26/2020 3:45 PM Medical Record Number: [id number] Patient Account Number: [id number] Date of Birth/Sex: 06-21-1980 (39 y.o. M) Treating RN: Carlene Coria Primary Care Caroline Matters: Lin Landsman Other Clinician: Jeanine Luz Referring Shaquon Gropp: Lin Landsman Treating Lilliah Priego/Extender: Jeri Cos Weeks in Treatment: 1 Wound Status Wound Number: 1 Primary Etiology: Open Surgical Wound Wound Location: Right Gluteus Secondary Etiology: Hidradenitis Wounding Event: Surgical Injury Wound Status: Open Date Acquired: 05/09/2020 Comorbid History: Seizure Disorder Weeks Of Treatment: 1 Clustered Wound: No Photos Wound  Measurements Length: (cm) 5.6 Width: (cm) 5 Depth: (cm) 0.6 Area: (cm) 21.991 Volume: (cm) 13.195 % Reduction in Area: -9.2% % Reduction in Volume: 34.5% Epithelialization: None Tunneling: No Undermining: No Wound Description Classification: Full Thickness Without Exposed  Support Structures Exudate Amount: Large Exudate Type: Serosanguineous Exudate Color: red, brown Foul Odor After Cleansing: No Slough/Fibrino Yes Wound Bed Granulation Amount: Large (67-100%) Exposed Structure Granulation Quality: Red Fascia Exposed: No Necrotic Amount: Small (1-33%) Fat Layer (Subcutaneous Tissue) Exposed: Yes Necrotic Quality: Adherent Slough Tendon Exposed: No Muscle Exposed: No Joint Exposed: No Bone Exposed: No Treatment Notes Wound #1 (Gluteus) Wound Laterality: Right Cleanser [place] Ancillary Kit - 15 Day Supply Discharge Instruction: Use supplies as instructed; Kit contains: (15) Saline Bullets; (15) 3x3 Gauze; 15 pr Gloves Peri-Wound Care Adam Christian, Adam Christian ([id number]) Topical Primary Dressing Secondary Dressing ABD Pad 5x9 (in/in) Discharge Instruction: Cover with ABD pad [place] Roll-Medium Discharge Instruction: Moistened with Dakins Solution, pack in wound Secured With 70M Medipore H Soft Cloth Surgical Tape, 2x2 (in/yd) Compression Wrap Compression Stockings Add-Ons Electronic Signature(s) Signed: 05/26/2020 4:49:43 PM By: Jeanine Luz Signed: 05/29/2020 7:55:11 AM By: Carlene Coria RN Entered By: Jeanine Luz on 05/26/2020 16:30:29 Adam Christian ([id number]) -------------------------------------------------------------------------------- Wound Assessment Details Patient Name: Adam Christian Date of Service: 05/26/2020 3:45 PM Medical Record Number: [id number] Patient Account Number: [id number] Date of Birth/Sex: 1980/10/21 (40 y.o. M) Treating RN: Carlene Coria Primary Care Raman Featherston: Lin Landsman Other Clinician: Jeanine Luz Referring Johnthan Axtman: Lin Landsman Treating Chantea Surace/Extender: Jeri Cos Weeks in Treatment: 1 Wound Status Wound Number: 2 Primary Etiology: Open Surgical Wound Wound Location: Left Gluteus Secondary Etiology: Hidradenitis Wounding Event: Surgical Injury Wound Status: Open Date Acquired:  05/09/2020 Comorbid History: Seizure Disorder Weeks Of Treatment: 1 Clustered Wound: No Photos Wound Measurements Length: (cm) 23 Width: (cm) 6 Depth: (cm) 1 Area: (cm) 108.385 Volume: (cm) 108.385 % Reduction in Area: 46.2% % Reduction in Volume: 58.6% Epithelialization: None Tunneling: No Undermining: Yes Starting Position (o'clock): 5 Ending Position (o'clock): 5 Maximum Distance: (cm) 1 Wound Description Classification: Full Thickness Without Exposed Support Structu Exudate Amount: Large Exudate Type: Serosanguineous Exudate Color: red, brown res Foul Odor After Cleansing: No Slough/Fibrino Yes Wound Bed Granulation Amount: Small (1-33%) Exposed Structure Granulation Quality: Red Fascia Exposed: No Necrotic Amount: Large (67-100%) Fat Layer (Subcutaneous Tissue) Exposed: Yes Necrotic Quality: Adherent Slough Tendon Exposed: No Muscle Exposed: No Joint Exposed: No Bone Exposed: No Treatment Notes Wound #2 (Gluteus) Wound Laterality: Left Cleanser [place] Ancillary Kit - [address] Adam Christian, Adam Christian ([id number]) Discharge Instruction: Use supplies as instructed; Kit contains: (15) Saline Bullets; (15) 3x3 Gauze; 15 pr Gloves Peri-Wound Care Topical Primary Dressing Secondary Dressing ABD Pad 5x9 (in/in) Discharge Instruction: Cover with ABD pad [place] Roll-Medium Discharge Instruction: Moistened with Dakins Solution, pack in wound Kerlix 4.5 x 4.1 (in/yd) Discharge Instruction: Apply Kerlix 4.5 x 4.1 (in/yd) as instructed Secured With 70M Medipore H Soft Cloth Surgical Tape, 2x2 (in/yd) Compression Wrap Compression Stockings Add-Ons Electronic Signature(s) Signed: 05/26/2020 4:49:43 PM By: Jeanine Luz Signed: 05/29/2020 7:55:11 AM By: Carlene Coria RN Entered By: Jeanine Luz on 05/26/2020 16:30:02 Adam Christian ([id number]) -------------------------------------------------------------------------------- Vitals Details Patient Name:  Adam Christian Date of Service: 05/26/2020 3:45 PM Medical Record Number: [id number] Patient Account Number: [id number] Date of Birth/Sex: [birth year] (40 y.o. M) Treating RN: Carlene Coria Primary Care Graham Hyun: Lin Landsman Other Clinician: Jeanine Luz Referring Ronell Boldin: Lin Landsman Treating Tiye Huwe/Extender: Skipper Cliche in Treatment: 1 Vital Signs Time Taken: 16:14 Temperature (F): 99.9 Height (in):  66 Pulse (bpm): 98 Weight (lbs): 151 Respiratory Rate (breaths/min): 18 Body Mass Index (BMI): 24.4 Blood Pressure (mmHg): 127/79 Reference Range: 80 - 120 mg / dl Electronic Signature(s) Signed: 05/26/2020 4:49:43 PM By: Jeanine Luz Entered By: Jeanine Luz on 05/26/2020 16:16:30

## 2020-06-02 ENCOUNTER — Encounter: Payer: BC Managed Care – PPO | Admitting: Physician Assistant

## 2020-06-02 ENCOUNTER — Other Ambulatory Visit: Payer: Self-pay

## 2020-06-02 DIAGNOSIS — L732 Hidradenitis suppurativa: Secondary | ICD-10-CM | POA: Diagnosis not present

## 2020-06-02 NOTE — Progress Notes (Addendum)
Adam Christian, Adam Christian ([id number]) Visit Report for 06/02/2020 Chief Complaint Document Details Patient Name: Adam Christian, Adam Christian Date of Service: 06/02/2020 9:45 AM Medical Record Number: [id number] Patient Account Number: [id number] Date of Birth/Sex: 22-Feb-1980 (40 y.o. Male) Treating RN: Carlene Coria Primary Care Provider: Lin Landsman Other Clinician: Referring Provider: Lin Landsman Treating Provider/Extender: Skipper Cliche in Treatment: 2 Information Obtained from: Patient Chief Complaint Bilateral gluteal, perineal, and groin ulceration due to hidradenitis Electronic Signature(s) Signed: 06/02/2020 10:24:10 AM By: Worthy Keeler PA-C Entered By: Worthy Keeler on 06/02/2020 10:24:10 Adam Christian, Adam Christian ([id number]) -------------------------------------------------------------------------------- HPI Details Patient Name: Adam Christian Date of Service: 06/02/2020 9:45 AM Medical Record Number: [id number] Patient Account Number: [id number] Date of Birth/Sex: 1980-06-21 (40 y.o. Male) Treating RN: Carlene Coria Primary Care Provider: Lin Landsman Other Clinician: Referring Provider: Lin Landsman Treating Provider/Extender: Skipper Cliche in Treatment: 2 History of Present Illness HPI Description: 05/18/2020 upon evaluation today patient appears to be doing poorly in regard to his wounds. This is actually a surgical wound at this point he has a longstanding history around 15 years of dealing with hidradenitis. Unfortunately he was getting increasingly tender and having a lot of discomfort subsequently Dr. Wilma Flavin who is the surgeon in [place] did actually take him to surgery to try to clear everything out to try to let this area to heal appropriately. Unfortunately that was not able to be done in 1 time according to what the patient tells me it seems that this is going require serial debridements. With that being said the patient apparently is a smoker and this is still  good to be a significant issue here as far as that is concerned. With a skin graft or flap that would obviously help to secure things much more quickly as far as healing is concerned but again he cannot be smoking if that is to be considered. He encouraged the patient that is Dr. Wilma Flavin to stop smoking which would also help ease the progression of the disease as well. The patient has no other major medical problems other than depressive disorder but he does not seem to be having issues with that particularly right now. He tells me he is currently out of pain medication though the 5 mg oxycodone was helping with dressing changes in the past. 05/26/2020 on evaluation today patient appears to be doing well with regard to his wounds. Honestly he is making excellent progress all things considered. These are significant wounds and I believe the Dakin's is helping to clean things up he also tells me is not having as much discomfort as it was previous. Fortunately there is no evidence of active infection at this time. 06/02/2020 upon evaluation today patient appears to be doing well with regard to his wounds. Is been tolerating the dressing changes without complication. Fortunately there is no signs of active infection at this time. No fevers, chills, nausea, vomiting, or diarrhea. He still has not gotten the [place] solution. He does have information for how to make this himself but I think we can probably send it to a different pharmacy to get this for him. Electronic Signature(s) Signed: 06/02/2020 10:36:07 AM By: Worthy Keeler PA-C Entered By: Worthy Keeler on 06/02/2020 10:36:06 Adam Christian, Adam Christian ([id number]) -------------------------------------------------------------------------------- Physical Exam Details Patient Name: Adam Christian Date of Service: 06/02/2020 9:45 AM Medical Record Number: [id number] Patient Account Number: [id number] Date of Birth/Sex: Dec 30, 1980 (40 y.o. Male) Treating RN:  Carlene Coria Primary Care Provider: Lin Landsman Other Clinician: Referring Provider: Ayesha Rumpf,  Betti Treating Provider/Extender: Jeri Cos Weeks in Treatment: 2 Constitutional Well-nourished and well-hydrated in no acute distress. Respiratory normal breathing without difficulty. Psychiatric this patient is able to make decisions and demonstrates good insight into disease process. Alert and Oriented x 3. pleasant and cooperative. Notes Upon inspection patient's wound bed actually showed signs of significant improvement I am actually very happy in this regard. Fortunately there does not appear to be any signs of active infection which is great news as well. He does have a little bit of green drainage but again this was minimal and I am not really certain this indicates an infection and may just be colonization with Pseudomonas. Nonetheless I think we get the [facility] solution going that will help hopefully kill off any surface bacteria causing this issue. Electronic Signature(s) Signed: 06/02/2020 10:36:37 AM By: Worthy Keeler PA-C Entered By: Worthy Keeler on 06/02/2020 10:36:36 Adam Christian ([id number]) -------------------------------------------------------------------------------- Physician Orders Details Patient Name: Adam Christian Date of Service: 06/02/2020 9:45 AM Medical Record Number: [id number] Patient Account Number: [id number] Date of Birth/Sex: [birth year] (40 y.o. Male) Treating RN: Carlene Coria Primary Care Provider: Lin Landsman Other Clinician: Referring Provider: Lin Landsman Treating Provider/Extender: Skipper Cliche in Treatment: 2 Verbal / Phone Orders: No Diagnosis Coding ICD-10 Coding Code Description L73.2 Hidradenitis suppurativa L98.492 Non-pressure chronic ulcer of skin of other sites with fat layer exposed F33.9 Major depressive disorder, recurrent, unspecified Follow-up Appointments o Return Appointment in 1 week. [place]: [facility] o ADMIT to [place] for wound care. May utilize formulary equivalent dressing for wound treatment orders unless otherwise specified. Home Health Nurse may visit PRN to address patientos wound care needs. - 3 times a week to assisted with dressing changes o Scheduled days for dressing changes to be completed; exception, patient has scheduled wound care visit that day. o **Please direct any NON-WOUND related issues/requests for orders to patient's Primary Care Physician. **If current dressing causes regression in wound condition, may D/C ordered dressing product/s and apply Normal Saline Moist Dressing daily until next [place] or Other MD appointment. **Notify [facility] of regression in wound condition at [phone]. Bathing/ Shower/ Hygiene o Clean wound with Normal Saline or wound cleanser. Wound Treatment Wound #1 - Gluteus Wound Laterality: Right Cleanser: Byram Ancillary Kit - 15 Day Supply (Generic) 1 x Per Day/30 Days Discharge Instructions: Use supplies as instructed; Kit contains: (15) Saline Bullets; (15) 3x3 Gauze; 15 pr Gloves Secondary Dressing: ABD Pad 5x9 (in/in) (Generic) 1 x Per Day/30 Days Discharge Instructions: Cover with ABD pad Secondary Dressing: Conforming Guaze Roll-Large (Generic) 1 x Per Day/30 Days Discharge Instructions: Apply Conforming Stretch Guaze Bandage as directed Secondary Dressing: Kerlix 4.5 x 4.1 (in/yd) (Generic) 1 x Per Day/30 Days Discharge Instructions: moistened with 1/4 strength dakins solution Secured With: 59M Medipore H Soft Cloth Surgical Tape, 2x2 (in/yd) (Generic) 1 x Per Day/30 Days Wound #2 - Gluteus Wound Laterality: Left Cleanser: Byram Ancillary Kit - 15 Day Supply (Generic) 1 x Per Day/30 Days Discharge Instructions: Use supplies as instructed; Kit contains: (15) Saline Bullets; (15) 3x3 Gauze; 15 pr Gloves Secondary Dressing: ABD Pad 5x9 (in/in) (Generic) 1 x Per Day/30  Days Discharge Instructions: Cover with ABD pad Secondary Dressing: Conforming Guaze Roll-Large (Generic) 1 x Per Day/30 Days Discharge Instructions: Apply Conforming Stretch Guaze Bandage as directed Secondary Dressing: Kerlix 4.5 x 4.1 (in/yd) (Generic) 1 x Per Day/30 Days Discharge Instructions: moistened with 1/4 strength dakins solution  Secured With: 75M Medipore H Soft Cloth Surgical Tape, 2x2 (in/yd) (Generic) 1 x Per Day/30 Days Adam Christian, Adam Christian ([id number]) Patient Medications Allergies: iodine Notifications Medication Indication Start End Dakin's Solution 06/02/2020 DOSE miscellaneous 0.25 % solution - Moisten gauze with Dakin's solution then wring out leaving gauze damp not saturated before packing into the wound bed lightly as directed in Electronic Signature(s) Signed: 06/13/2020 8:19:13 AM By: Worthy Keeler PA-C Signed: 06/26/2020 8:34:40 AM By: Carlene Coria RN Previous Signature: 06/02/2020 10:38:24 AM Version By: Worthy Keeler PA-C Entered By: Carlene Coria on 06/08/2020 08:36:17 Adam Christian ([id number]) -------------------------------------------------------------------------------- Problem List Details Patient Name: Adam Christian Date of Service: 06/02/2020 9:45 AM Medical Record Number: [id number] Patient Account Number: [id number] Date of Birth/Sex: 11/13/1980 (39 y.o. Male) Treating RN: Carlene Coria Primary Care Provider: Lin Landsman Other Clinician: Referring Provider: Lin Landsman Treating Provider/Extender: Skipper Cliche in Treatment: 2 Active Problems ICD-10 Encounter Code Description Active Date MDM Diagnosis L73.2 Hidradenitis suppurativa 05/18/2020 No Yes L98.492 Non-pressure chronic ulcer of skin of other sites with fat layer exposed 05/18/2020 No Yes F33.9 Major depressive disorder, recurrent, unspecified 05/18/2020 No Yes Inactive Problems Resolved Problems Electronic Signature(s) Signed: 06/02/2020 10:24:05 AM By: Worthy Keeler PA-C Entered  By: Worthy Keeler on 06/02/2020 10:24:05 Adam Christian ([id number]) -------------------------------------------------------------------------------- Progress Note Details Patient Name: Adam Christian Date of Service: 06/02/2020 9:45 AM Medical Record Number: [id number] Patient Account Number: [id number] Date of Birth/Sex: December 16, 1980 (39 y.o. Male) Treating RN: Carlene Coria Primary Care Provider: Lin Landsman Other Clinician: Referring Provider: Lin Landsman Treating Provider/Extender: Skipper Cliche in Treatment: 2 Subjective Chief Complaint Information obtained from Patient Bilateral gluteal, perineal, and groin ulceration due to hidradenitis History of Present Illness (HPI) 05/18/2020 upon evaluation today patient appears to be doing poorly in regard to his wounds. This is actually a surgical wound at this point he has a longstanding history around 15 years of dealing with hidradenitis. Unfortunately he was getting increasingly tender and having a lot of discomfort subsequently Dr. Wilma Flavin who is the surgeon in [place] did actually take him to surgery to try to clear everything out to try to let this area to heal appropriately. Unfortunately that was not able to be done in 1 time according to what the patient tells me it seems that this is going require serial debridements. With that being said the patient apparently is a smoker and this is still good to be a significant issue here as far as that is concerned. With a skin graft or flap that would obviously help to secure things much more quickly as far as healing is concerned but again he cannot be smoking if that is to be considered. He encouraged the patient that is Dr. Wilma Flavin to stop smoking which would also help ease the progression of the disease as well. The patient has no other major medical problems other than depressive disorder but he does not seem to be having issues with that particularly right now. He  tells me he is currently out of pain medication though the 5 mg oxycodone was helping with dressing changes in the past. 05/26/2020 on evaluation today patient appears to be doing well with regard to his wounds. Honestly he is making excellent progress all things considered. These are significant wounds and I believe the Dakin's is helping to clean things up he also tells me is not having as much discomfort as it was previous. Fortunately there is no evidence of active infection at this time. 06/02/2020  upon evaluation today patient appears to be doing well with regard to his wounds. Is been tolerating the dressing changes without complication. Fortunately there is no signs of active infection at this time. No fevers, chills, nausea, vomiting, or diarrhea. He still has not gotten the [place] solution. He does have information for how to make this himself but I think we can probably send it to a different pharmacy to get this for him. Objective Constitutional Well-nourished and well-hydrated in no acute distress. Vitals Time Taken: 9:58 AM, Height: 66 in, Weight: 151 lbs, BMI: 24.4, Temperature: 98.4 F, Pulse: 75 bpm, Respiratory Rate: 18 breaths/min, Blood Pressure: 136/87 mmHg. Respiratory normal breathing without difficulty. Psychiatric this patient is able to make decisions and demonstrates good insight into disease process. Alert and Oriented x 3. pleasant and cooperative. General Notes: Upon inspection patient's wound bed actually showed signs of significant improvement I am actually very happy in this regard. Fortunately there does not appear to be any signs of active infection which is great news as well. He does have a little bit of green drainage but again this was minimal and I am not really certain this indicates an infection and may just be colonization with Pseudomonas. Nonetheless I think we get the [facility] solution going that will help hopefully kill off any surface bacteria causing  this issue. Integumentary (Hair, Skin) Wound #1 status is Open. Original cause of wound was Surgical Injury. The date acquired was: 05/09/2020. The wound has been in treatment 2 weeks. The wound is located on the Right Gluteus. The wound measures 5cm length x 5.5cm width x 0.5cm depth; 21.598cm^2 area and 10.799cm^3 volume. There is Fat Layer (Subcutaneous Tissue) exposed. There is no tunneling or undermining noted. There is a large amount of serosanguineous drainage noted. There is large (67-100%) red granulation within the wound bed. There is no necrotic tissue within the wound bed. Wound #2 status is Open. Original cause of wound was Surgical Injury. The date acquired was: 05/09/2020. The wound has been in treatment 2 weeks. The wound is located on the Left Gluteus. The wound measures 19.5cm length x 6cm width x 0.9cm depth; 91.892cm^2 area and Adam Christian, Adam Christian ([id number]) 82.702cm^3 volume. There is Fat Layer (Subcutaneous Tissue) exposed. There is no tunneling or undermining noted. There is a large amount of serosanguineous drainage noted. There is large (67-100%) red granulation within the wound bed. There is a small (1-33%) amount of necrotic tissue within the wound bed including Adherent Slough. Assessment Active Problems ICD-10 Hidradenitis suppurativa Non-pressure chronic ulcer of skin of other sites with fat layer exposed Major depressive disorder, recurrent, unspecified Plan Follow-up Appointments: Return Appointment in 1 week. Home Health: [place]: [facility] ADMIT to [place] for wound care. May utilize formulary equivalent dressing for wound treatment orders unless otherwise specified. Home Health Nurse may visit PRN to address patient s wound care needs. - 3 times a week to assisted with dressing changes Scheduled days for dressing changes to be completed; exception, patient has scheduled wound care visit that day. **Please direct any NON-WOUND related  issues/requests for orders to patient's Primary Care Physician. **If current dressing causes regression in wound condition, may D/C ordered dressing product/s and apply Normal Saline Moist Dressing daily until next [place] or Other MD appointment. **Notify Wound Healing Center of regression in wound condition at [phone]. Bathing/ Shower/ Hygiene: Clean wound with Normal Saline or wound cleanser. The following medication(s) was prescribed: Dakin's Solution miscellaneous 0.25 % solution  Moisten gauze with Dakin's solution then wring out leaving gauze damp not saturated before packing into the wound bed lightly as directed in starting 06/02/2020 WOUND #1: - Gluteus Wound Laterality: Right Cleanser: Byram Ancillary Kit - 15 Day Supply (Generic) 1 x Per Day/30 Days Discharge Instructions: Use supplies as instructed; Kit contains: (15) Saline Bullets; (15) 3x3 Gauze; 15 pr Gloves Secondary Dressing: ABD Pad 5x9 (in/in) (Generic) 1 x Per Day/30 Days Discharge Instructions: Cover with ABD pad Secondary Dressing: Conforming Guaze Roll-Large (Generic) 1 x Per Day/30 Days Discharge Instructions: Apply Conforming Stretch Guaze Bandage as directed Secondary Dressing: Kerlix 4.5 x 4.1 (in/yd) (Generic) 1 x Per Day/30 Days Discharge Instructions: Apply Kerlix 4.5 x 4.1 (in/yd) as instructed Secured With: 29M Medipore H Soft Cloth Surgical Tape, 2x2 (in/yd) (Generic) 1 x Per Day/30 Days WOUND #2: - Gluteus Wound Laterality: Left Cleanser: Byram Ancillary Kit - 15 Day Supply (Generic) 1 x Per Day/30 Days Discharge Instructions: Use supplies as instructed; Kit contains: (15) Saline Bullets; (15) 3x3 Gauze; 15 pr Gloves Secondary Dressing: ABD Pad 5x9 (in/in) (Generic) 1 x Per Day/30 Days Discharge Instructions: Cover with ABD pad Secondary Dressing: Conforming Guaze Roll-Large (Generic) 1 x Per Day/30 Days Discharge Instructions: Apply Conforming Stretch Guaze Bandage as directed Secondary  Dressing: Kerlix 4.5 x 4.1 (in/yd) (Generic) 1 x Per Day/30 Days Discharge Instructions: Apply Kerlix 4.5 x 4.1 (in/yd) as instructed Secured With: 29M Medipore H Soft Cloth Surgical Tape, 2x2 (in/yd) (Generic) 1 x Per Day/30 Days 1. Would recommend currently that we going continue with the wound care measures as before and the patient is in agreement with the plan. This includes the use of the Dakin's moistened gauze packed into the wound I think this is ideal. 2. I am also can recommend he continue to change this daily. 3. I am also going to suggest that the patient continue to monitor for any signs of worsening as far as increased pain or drainage is concerned also if he has more green drainage she should let us know as well though right now I do not really think this represents an infection but more like colonization with Pseudomonas most likely. We will see patient back for reevaluation in 1 week here in the clinic. If anything worsens or changes patient will contact our office for additional recommendations. Adam Christian, Adam Christian ([id number]) Electronic Signature(s) Signed: 06/02/2020 10:38:34 AM By: Worthy Keeler PA-C Entered By: Worthy Keeler on 06/02/2020 10:38:34 Adam Christian, Adam Christian ([id number]) -------------------------------------------------------------------------------- SuperBill Details Patient Name: Adam Christian Date of Service: 06/02/2020 Medical Record Number: [id number] Patient Account Number: [id number] Date of Birth/Sex: 11/01/1980 (39 y.o. Male) Treating RN: Carlene Coria Primary Care Provider: Lin Landsman Other Clinician: Referring Provider: Lin Landsman Treating Provider/Extender: Skipper Cliche in Treatment: 2 Diagnosis Coding ICD-10 Codes Code Description L73.2 Hidradenitis suppurativa L98.492 Non-pressure chronic ulcer of skin of other sites with fat layer exposed F33.9 Major depressive disorder, recurrent, unspecified Facility Procedures CPT4 Code:  [id number] Description: 99214 - WOUND CARE VISIT-LEV 4 EST PT Modifier: Quantity: 1 Physician Procedures CPT4 Code: [id number] Description: 48016 - WC PHYS LEVEL 4 - EST PT Modifier: Quantity: 1 CPT4 Code: Description: ICD-10 Diagnosis Description L73.2 Hidradenitis suppurativa L98.492 Non-pressure chronic ulcer of skin of other sites with fat layer expos F33.9 Major depressive disorder, recurrent, unspecified Modifier: ed Quantity: Electronic Signature(s) Signed: 06/02/2020 10:38:44 AM By: Worthy Keeler PA-C Entered By: Worthy Keeler on 06/02/2020 10:38:44

## 2020-06-07 NOTE — Progress Notes (Signed)
Adam Christian, Adam Christian ([id number]) Visit Report for 06/02/2020 Arrival Information Details Patient Name: Adam Christian, Adam Christian Date of Service: 06/02/2020 9:45 AM Medical Record Number: [id number] Patient Account Number: [id number] Date of Birth/Sex: [birth year] (40 y.o. Male) Treating Christian: Adam Christian Primary Care Adam Christian: Adam Christian Other Clinician: Referring Adam Christian: Adam Christian Treating Adam Christian/Extender: Adam Christian in Treatment: 2 Visit Information History Since Last Visit Added or deleted any medications: No Patient Arrived: Adam Christian Had a fall or experienced change in No Arrival Time: 09:53 activities of daily living that may affect Accompanied By: self risk of falls: Transfer Assistance: None Hospitalized since last visit: No Patient Identification Verified: Yes Has Dressing in Place as Prescribed: Yes Secondary Verification Process Completed: Yes Pain Present Now: Yes Electronic Signature(Adam) Signed: 06/02/2020 5:00:25 PM By: Adam Christian, Adam Christian Entered By: Adam Christian, Kenia on 06/02/2020 09:58:31 Adam Christian ([id number]) -------------------------------------------------------------------------------- Clinic Level of Care Assessment Details Patient Name: Adam Christian Date of Service: 06/02/2020 9:45 AM Medical Record Number: [id number] Patient Account Number: [id number] Date of Birth/Sex: May 11, 1980 (40 y.o. Male) Treating Christian: Adam Christian Primary Care Adam Christian: Adam Christian Other Clinician: Referring Adam Christian: Adam Christian Treating Adam Christian/Extender: Adam Christian in Treatment: 2 Clinic Level of Care Assessment Items TOOL 4 Quantity Score X - Use when only an EandM is performed on FOLLOW-UP visit 1 0 ASSESSMENTS - Nursing Assessment / Reassessment X - Reassessment of Co-morbidities (includes updates in patient status) 1 10 X- 1 5 Reassessment of Adherence to Treatment Plan ASSESSMENTS - Wound and Skin Assessment / Reassessment []  - Simple Wound  Assessment / Reassessment - one wound 0 X- 2 5 Complex Wound Assessment / Reassessment - multiple wounds []  - 0 Dermatologic / Skin Assessment (not related to wound area) ASSESSMENTS - Focused Assessment []  - Circumferential Edema Measurements - multi extremities 0 []  - 0 Nutritional Assessment / Counseling / Intervention []  - 0 Lower Extremity Assessment (monofilament, tuning fork, pulses) []  - 0 Peripheral Arterial Disease Assessment (using hand held doppler) ASSESSMENTS - Ostomy and/or Continence Assessment and Care []  - Incontinence Assessment and Management 0 []  - 0 Ostomy Care Assessment and Management (repouching, etc.) PROCESS - Coordination of Care []  - Simple Patient / Family Education for ongoing care 0 X- 1 20 Complex (extensive) Patient / Family Education for ongoing care []  - 0 Staff obtains Programmer, systems, Records, Test Results / Process Orders []  - 0 Staff telephones HHA, Nursing Homes / Clarify orders / etc []  - 0 Routine Transfer to another Facility (non-emergent condition) []  - 0 Routine Hospital Admission (non-emergent condition) []  - 0 New Admissions / Biomedical engineer / Ordering NPWT, Apligraf, etc. []  - 0 Emergency Hospital Admission (emergent condition) X- 1 10 Simple Discharge Coordination []  - 0 Complex (extensive) Discharge Coordination PROCESS - Special Needs []  - Pediatric / Minor Patient Management 0 []  - 0 Isolation Patient Management []  - 0 Hearing / Language / Visual special needs []  - 0 Assessment of Community assistance (transportation, D/C planning, etc.) []  - 0 Additional assistance / Altered mentation []  - 0 Support Surface(Adam) Assessment (bed, cushion, seat, etc.) INTERVENTIONS - Wound Cleansing / Measurement Adam Christian ([id number]) []  - 0 Simple Wound Cleansing - one wound X- 2 5 Complex Wound Cleansing - multiple wounds X- 1 5 Wound Imaging (photographs - any number of wounds) []  - 0 Wound Tracing (instead of  photographs) []  - 0 Simple Wound Measurement - one wound X- 2 5 Complex Wound Measurement - multiple wounds INTERVENTIONS - Wound Dressings []  - Small Wound Dressing  one or multiple wounds 0 X- 2 15 Medium Wound Dressing one or multiple wounds []  - 0 Large Wound Dressing one or multiple wounds X- 1 5 Application of Medications - topical []  - 0 Application of Medications - injection INTERVENTIONS - Miscellaneous []  - External ear exam 0 []  - 0 Specimen Collection (cultures, biopsies, blood, body fluids, etc.) []  - 0 Specimen(Adam) / Culture(Adam) sent or taken to Lab for analysis []  - 0 Patient Transfer (multiple staff / Civil Service fast streamer / Similar devices) []  - 0 Simple Staple / Suture removal (25 or less) []  - 0 Complex Staple / Suture removal (26 or more) []  - 0 Hypo / Hyperglycemic Management (close monitor of Blood Glucose) []  - 0 Ankle / Brachial Index (ABI) - do not check if billed separately X- 1 5 Vital Signs Has the patient been seen at the hospital within the last three years: Yes Total Score: 120 Level Of Care: New/Established - Level 4 Electronic Signature(Adam) Signed: 06/07/2020 3:51:10 PM By: Adam Coria Christian Entered By: Adam Christian on 06/02/2020 10:31:19 Adam Christian ([id number]) -------------------------------------------------------------------------------- Encounter Discharge Information Details Patient Name: Adam Christian Date of Service: 06/02/2020 9:45 AM Medical Record Number: [id number] Patient Account Number: [id number] Date of Birth/Sex: Sep 04, 1980 (40 y.o. Male) Treating Christian: Adam Christian Primary Care Laiden Milles: Adam Christian Other Clinician: Referring Shantice Christian: Adam Christian Treating Vineet Kinney/Extender: Adam Christian in Treatment: 2 Encounter Discharge Information Items Discharge Condition: Stable Ambulatory Status: Cane Discharge Destination: Home Transportation: Private Auto Accompanied By: sister Schedule Follow-up Appointment: Yes Clinical  Summary of Care: Electronic Signature(Adam) Signed: 06/02/2020 4:01:56 PM By: Adam Christian Entered By: Adam Christian on 06/02/2020 10:45:45 Adam Christian ([id number]) -------------------------------------------------------------------------------- Lower Extremity Assessment Details Patient Name: Adam Christian Date of Service: 06/02/2020 9:45 AM Medical Record Number: [id number] Patient Account Number: [id number] Date of Birth/Sex: 07-30-1980 (40 y.o. Male) Treating Christian: Adam Christian Primary Care Tamera Pingley: Adam Christian Other Clinician: Referring Armilda Vanderlinden: Adam Christian Treating Karalina Tift/Extender: Jeri Cos Weeks in Treatment: 2 Electronic Signature(Adam) Signed: 06/02/2020 5:00:25 PM By: Adam Christian, Adam Christian Entered By: Adam Christian, Adam Breeding on 06/02/2020 10:12:16 Adam Christian, Adam Christian ([id number]) -------------------------------------------------------------------------------- Multi Wound Chart Details Patient Name: Adam Christian Date of Service: 06/02/2020 9:45 AM Medical Record Number: [id number] Patient Account Number: [id number] Date of Birth/Sex: 1980-04-30 (40 y.o. Male) Treating Christian: Adam Christian Primary Care Ivania Teagarden: Adam Christian Other Clinician: Referring Rashiya Lofland: Adam Christian Treating Freda Jaquith/Extender: Jeri Cos Weeks in Treatment: 2 Vital Signs Height(in): 66 Pulse(bpm): 75 Weight(lbs): 151 Blood Pressure(mmHg): 136/87 Body Mass Index(BMI): 24 Temperature(F): 98.4 Respiratory Rate(breaths/min): 18 Photos: [N/A:N/A] Wound Location: Right Gluteus Left Gluteus N/A Wounding Event: Surgical Injury Surgical Injury N/A Primary Etiology: Open Surgical Wound Open Surgical Wound N/A Secondary Etiology: Hidradenitis Hidradenitis N/A Comorbid History: Seizure Disorder Seizure Disorder N/A Date Acquired: 05/09/2020 05/09/2020 N/A Weeks of Treatment: 2 2 N/A Wound Status: Open Open N/A Measurements L x W x D (cm) 5x5.5x0.5 19.5x6x0.9 N/A Area (cm) : 21.598 91.892  N/A Volume (cm) : 10.799 82.702 N/A % Reduction in Area: -7.20% 54.40% N/A % Reduction in Volume: 46.40% 68.40% N/A Classification: Full Thickness Without Exposed Full Thickness Without Exposed N/A Support Structures Support Structures Exudate Amount: Large Large N/A Exudate Type: Serosanguineous Serosanguineous N/A Exudate Color: red, brown red, brown N/A Granulation Amount: Large (67-100%) Large (67-100%) N/A Granulation Quality: Red Red N/A Necrotic Amount: None Present (0%) Small (1-33%) N/A Exposed Structures: Fat Layer (Subcutaneous Tissue): Fat Layer (Subcutaneous Tissue): N/A Yes Yes Fascia: No Fascia: No Tendon: No Tendon: No Muscle: No Muscle: No Joint:  No Joint: No Bone: No Bone: No Epithelialization: None None N/A Treatment Notes Electronic Signature(Adam) Signed: 06/07/2020 3:51:10 PM By: Adam Coria Christian Entered By: Adam Christian on 06/02/2020 10:26:33 Adam Christian ([id number]) -------------------------------------------------------------------------------- Multi-Disciplinary Care Plan Details Patient Name: Adam Christian Date of Service: 06/02/2020 9:45 AM Medical Record Number: [id number] Patient Account Number: [id number] Date of Birth/Sex: Sep 05, 1980 (40 y.o. Male) Treating Christian: Adam Christian Primary Care Laurna Shetley: Adam Christian Other Clinician: Referring Kiera Hussey: Adam Christian Treating Draven Laine/Extender: Adam Christian in Treatment: 2 Active Inactive Wound/Skin Impairment Nursing Diagnoses: Impaired tissue integrity Goals: Patient/caregiver will verbalize understanding of skin care regimen Date Initiated: 05/18/2020 Target Resolution Date: 06/17/2020 Goal Status: Active Ulcer/skin breakdown will have a volume reduction of 30% by week 4 Date Initiated: 05/18/2020 Target Resolution Date: 06/17/2020 Goal Status: Active Ulcer/skin breakdown will have a volume reduction of 50% by week 8 Date Initiated: 05/18/2020 Target Resolution Date: 07/18/2020 Goal  Status: Active Ulcer/skin breakdown will have a volume reduction of 80% by week 12 Date Initiated: 05/18/2020 Target Resolution Date: 08/17/2020 Goal Status: Active Ulcer/skin breakdown will heal within 14 weeks Date Initiated: 05/18/2020 Target Resolution Date: 09/17/2020 Goal Status: Active Interventions: Assess patient/caregiver ability to obtain necessary supplies Assess patient/caregiver ability to perform ulcer/skin care regimen upon admission and as needed Assess ulceration(Adam) every visit Provide education on ulcer and skin care Treatment Activities: Referred to DME Haelie Clapp for dressing supplies : 05/18/2020 Skin care regimen initiated : 05/18/2020 Notes: Electronic Signature(Adam) Signed: 06/07/2020 3:51:10 PM By: Adam Coria Christian Entered By: Adam Christian on 06/02/2020 10:26:22 Adam Christian ([id number]) -------------------------------------------------------------------------------- Pain Assessment Details Patient Name: Adam Christian Date of Service: 06/02/2020 9:45 AM Medical Record Number: [id number] Patient Account Number: [id number] Date of Birth/Sex: [birth year] (40 y.o. Male) Treating Christian: Adam Christian Primary Care Tison Leibold: Adam Christian Other Clinician: Referring Josceline Chenard: Adam Christian Treating Dawne Casali/Extender: Adam Christian in Treatment: 2 Active Problems Location of Pain Severity and Description of Pain Patient Has Paino Yes Site Locations Pain Location: Pain in Ulcers Rate the pain. Current Pain Level: 5 Pain Management and Medication Current Pain Management: Electronic Signature(Adam) Signed: 06/02/2020 5:00:25 PM By: Adam Christian, Adam Christian Entered By: Adam Christian, Adam Breeding on 06/02/2020 10:00:51 Adam Christian ([id number]) -------------------------------------------------------------------------------- Patient/Caregiver Education Details Patient Name: Adam Christian Date of Service: 06/02/2020 9:45 AM Medical Record Number: [id number] Patient Account  Number: [id number] Date of Birth/Gender: 1980-07-21 (40 y.o. Male) Treating Christian: Adam Christian Primary Care Physician: Adam Christian Other Clinician: Referring Physician: Lin Christian Treating Physician/Extender: Adam Christian in Treatment: 2 Education Assessment Education Provided To: Patient Education Topics Provided Wound/Skin Impairment: Methods: Explain/Verbal Responses: State content correctly Electronic Signature(Adam) Signed: 06/07/2020 3:51:10 PM By: Adam Coria Christian Entered By: Adam Christian on 06/02/2020 10:32:04 Adam Christian ([id number]) -------------------------------------------------------------------------------- Wound Assessment Details Patient Name: Adam Christian Date of Service: 06/02/2020 9:45 AM Medical Record Number: [id number] Patient Account Number: [id number] Date of Birth/Sex: 10-11-1980 (40 y.o. Male) Treating Christian: Adam Christian Primary Care Saphyre Cillo: Adam Christian Other Clinician: Referring Kalib Bhagat: Adam Christian Treating Vlasta Baskin/Extender: Jeri Cos Weeks in Treatment: 2 Wound Status Wound Number: 1 Primary Etiology: Open Surgical Wound Wound Location: Right Gluteus Secondary Etiology: Hidradenitis Wounding Event: Surgical Injury Wound Status: Open Date Acquired: 05/09/2020 Comorbid History: Seizure Disorder Weeks Of Treatment: 2 Clustered Wound: No Photos Wound Measurements Length: (cm) 5 Width: (cm) 5.5 Depth: (cm) 0.5 Area: (cm) 21.598 Volume: (cm) 10.799 % Reduction in Area: -7.2% % Reduction in Volume: 46.4% Epithelialization: None Tunneling: No Undermining: No Wound Description Classification: Full Thickness Without Exposed Support Structures  Exudate Amount: Large Exudate Type: Serosanguineous Exudate Color: red, brown Foul Odor After Cleansing: No Slough/Fibrino No Wound Bed Granulation Amount: Large (67-100%) Exposed Structure Granulation Quality: Red Fascia Exposed: No Necrotic Amount: None Present (0%) Fat Layer  (Subcutaneous Tissue) Exposed: Yes Tendon Exposed: No Muscle Exposed: No Joint Exposed: No Bone Exposed: No Treatment Notes Wound #1 (Gluteus) Wound Laterality: Right Cleanser [place]ram Ancillary Kit - 15 Day Supply Discharge Instruction: Use supplies as instructed; Kit contains: (15) Saline Bullets; (15) 3x3 Gauze; 15 pr Gloves Peri-Wound Care Adam Christian, Adam Christian ([id number]) Topical Primary Dressing Secondary Dressing ABD Pad 5x9 (in/in) Discharge Instruction: Cover with ABD pad [place] Discharge Instruction: Apply Conforming Stretch Guaze Bandage as directed Kerlix 4.5 x 4.1 (in/yd) Discharge Instruction: Apply Kerlix 4.5 x 4.1 (in/yd) as instructed Secured With 54M Medipore H Soft Cloth Surgical Tape, 2x2 (in/yd) Compression Wrap Compression Stockings Add-Ons Electronic Signature(Adam) Signed: 06/02/2020 5:00:25 PM By: Adam Christian, Adam Christian Entered By: Adam Christian, Adam Breeding on 06/02/2020 10:11:22 Adam Christian ([id number]) -------------------------------------------------------------------------------- Wound Assessment Details Patient Name: Adam Christian Date of Service: 06/02/2020 9:45 AM Medical Record Number: [id number] Patient Account Number: [id number] Date of Birth/Sex: 31-Jul-1980 (40 y.o. Male) Treating Christian: Adam Christian Primary Care Raunak Antuna: Adam Christian Other Clinician: Referring Taraneh Metheney: Adam Christian Treating Nayib Remer/Extender: Jeri Cos Weeks in Treatment: 2 Wound Status Wound Number: 2 Primary Etiology: Open Surgical Wound Wound Location: Left Gluteus Secondary Etiology: Hidradenitis Wounding Event: Surgical Injury Wound Status: Open Date Acquired: 05/09/2020 Comorbid History: Seizure Disorder Weeks Of Treatment: 2 Clustered Wound: No Photos Wound Measurements Length: (cm) 19.5 Width: (cm) 6 Depth: (cm) 0.9 Area: (cm) 91.892 Volume: (cm) 82.702 % Reduction in Area: 54.4% % Reduction in Volume: 68.4% Epithelialization:  None Tunneling: No Undermining: No Wound Description Classification: Full Thickness Without Exposed Support Structures Exudate Amount: Large Exudate Type: Serosanguineous Exudate Color: red, brown Foul Odor After Cleansing: No Slough/Fibrino Yes Wound Bed Granulation Amount: Large (67-100%) Exposed Structure Granulation Quality: Red Fascia Exposed: No Necrotic Amount: Small (1-33%) Fat Layer (Subcutaneous Tissue) Exposed: Yes Necrotic Quality: Adherent Slough Tendon Exposed: No Muscle Exposed: No Joint Exposed: No Bone Exposed: No Treatment Notes Wound #2 (Gluteus) Wound Laterality: Left Cleanser Byram Ancillary Kit - 15 Day Supply Discharge Instruction: Use supplies as instructed; Kit contains: (15) Saline Bullets; (15) 3x3 Gauze; 15 pr Gloves Peri-Wound Care Adam Christian, Adam Christian ([id number]) Topical Primary Dressing Secondary Dressing ABD Pad 5x9 (in/in) Discharge Instruction: Cover with ABD pad [place] Discharge Instruction: Apply Conforming Stretch Guaze Bandage as directed Kerlix 4.5 x 4.1 (in/yd) Discharge Instruction: Apply Kerlix 4.5 x 4.1 (in/yd) as instructed Secured With 54M Medipore H Soft Cloth Surgical Tape, 2x2 (in/yd) Compression Wrap Compression Stockings Add-Ons Electronic Signature(Adam) Signed: 06/02/2020 5:00:25 PM By: Adam Christian, Adam Christian Entered By: Adam Christian, Adam Breeding on 06/02/2020 10:11:59 Adam Christian ([id number]) -------------------------------------------------------------------------------- Vitals Details Patient Name: Adam Christian Date of Service: 06/02/2020 9:45 AM Medical Record Number: [id number] Patient Account Number: [id number] Date of Birth/Sex: Feb 19, 1980 (40 y.o. Male) Treating Christian: Adam Christian Primary Care Syair Fricker: Adam Christian Other Clinician: Referring Miguel Medal: Adam Christian Treating Aleister Lady/Extender: Jeri Cos Weeks in Treatment: 2 Vital Signs Time Taken: 09:58 Temperature (F):  98.4 Height (in): 66 Pulse (bpm): 75 Weight (lbs): 151 Respiratory Rate (breaths/min): 18 Body Mass Index (BMI): 24.4 Blood Pressure (mmHg): 136/87 Reference Range: 80 - 120 mg / dl Electronic Signature(Adam) Signed: 06/02/2020 5:00:25 PM By: Adam Christian, Adam Christian Entered By: Adam Christian, Adam Breeding on 06/02/2020 10:00:39

## 2020-06-09 ENCOUNTER — Ambulatory Visit: Payer: BC Managed Care – PPO | Admitting: Internal Medicine

## 2020-06-16 ENCOUNTER — Ambulatory Visit: Payer: BC Managed Care – PPO | Admitting: Physician Assistant

## 2020-06-20 ENCOUNTER — Ambulatory Visit: Payer: BC Managed Care – PPO | Admitting: Physician Assistant

## 2020-06-30 ENCOUNTER — Other Ambulatory Visit: Payer: Self-pay

## 2020-06-30 ENCOUNTER — Encounter: Payer: BC Managed Care – PPO | Attending: Physician Assistant | Admitting: Physician Assistant

## 2020-06-30 DIAGNOSIS — L732 Hidradenitis suppurativa: Secondary | ICD-10-CM | POA: Insufficient documentation

## 2020-06-30 DIAGNOSIS — T8189XA Other complications of procedures, not elsewhere classified, initial encounter: Secondary | ICD-10-CM | POA: Diagnosis not present

## 2020-06-30 DIAGNOSIS — L98492 Non-pressure chronic ulcer of skin of other sites with fat layer exposed: Secondary | ICD-10-CM | POA: Insufficient documentation

## 2020-06-30 DIAGNOSIS — Y838 Other surgical procedures as the cause of abnormal reaction of the patient, or of later complication, without mention of misadventure at the time of the procedure: Secondary | ICD-10-CM | POA: Diagnosis not present

## 2020-06-30 DIAGNOSIS — F17200 Nicotine dependence, unspecified, uncomplicated: Secondary | ICD-10-CM | POA: Insufficient documentation

## 2020-06-30 NOTE — Progress Notes (Addendum)
[name], [name] ([id number]) Visit Report for 06/30/2020 Chief Complaint Document Details Patient Name: Adam Christian, Adam Christian Date of Service: 06/30/2020 9:45 AM Medical Record Number: [id number] Patient Account Number: [id number] Date of Birth/Sex: [birth year] (40 y.o. M) Treating RN: Carlene Coria Primary Care Provider: Lin Landsman Other Clinician: Referring Provider: Lin Landsman Treating Provider/Extender: Skipper Cliche in Treatment: 6 Information Obtained from: Patient Chief Complaint Bilateral gluteal, perineal, and groin ulceration due to hidradenitis Electronic Signature(s) Signed: 06/30/2020 10:09:48 AM By: Worthy Keeler PA-C Entered By: Worthy Keeler on 06/30/2020 10:09:48 [name], [name] ([id number]) -------------------------------------------------------------------------------- HPI Details Patient Name: Adam Christian Date of Service: 06/30/2020 9:45 AM Medical Record Number: [id number] Patient Account Number: [id number] Date of Birth/Sex: 12-Dec-1980 (40 y.o. M) Treating RN: Carlene Coria Primary Care Provider: Lin Landsman Other Clinician: Referring Provider: Lin Landsman Treating Provider/Extender: Skipper Cliche in Treatment: 6 History of Present Illness HPI Description: 05/18/2020 upon evaluation today patient appears to be doing poorly in regard to his wounds. This is actually a surgical wound at this point he has a longstanding history around 15 years of dealing with hidradenitis. Unfortunately he was getting increasingly tender and having a lot of discomfort subsequently Dr. Wilma Flavin who is the surgeon in [employer] did actually take him to surgery to try to clear everything out to try to let this area to heal appropriately. Unfortunately that was not able to be done in 1 time according to what the patient tells me it seems that this is going require serial debridements. With that being said the patient apparently is a smoker and this is still good to  be a significant issue here as far as that is concerned. With a skin graft or flap that would obviously help to secure things much more quickly as far as healing is concerned but again he cannot be smoking if that is to be considered. He encouraged the patient that is Dr. Wilma Flavin to stop smoking which would also help ease the progression of the disease as well. The patient has no other major medical problems other than depressive disorder but he does not seem to be having issues with that particularly right now. He tells me he is currently out of pain medication though the 5 mg oxycodone was helping with dressing changes in the past. 05/26/2020 on evaluation today patient appears to be doing well with regard to his wounds. Honestly he is making excellent progress all things considered. These are significant wounds and I believe the Dakin's is helping to clean things up he also tells me is not having as much discomfort as it was previous. Fortunately there is no evidence of active infection at this time. 06/02/2020 upon evaluation today patient appears to be doing well with regard to his wounds. Is been tolerating the dressing changes without complication. Fortunately there is no signs of active infection at this time. No fevers, chills, nausea, vomiting, or diarrhea. He still has not gotten the [place] solution. He does have information for how to make this himself but I think we can probably send it to a different pharmacy to get this for him. 06/30/2020 upon evaluation today patient appears to be doing excellent in regard to his wounds. Is been tolerating the dressing changes without complication. Overall the Dakin solution seems to be doing great and he in general is also doing excellent. Electronic Signature(s) Signed: 06/30/2020 10:33:42 AM By: Worthy Keeler PA-C Entered By: Worthy Keeler on 06/30/2020 10:33:42 Adam Christian  ([id number]) -------------------------------------------------------------------------------- Physical Exam  Details Patient Name: [name], [name] Date of Service: 06/30/2020 9:45 AM Medical Record Number: [id number] Patient Account Number: [id number] Date of Birth/Sex: 1980/10/07 (40 y.o. M) Treating RN: Carlene Coria Primary Care Provider: Lin Landsman Other Clinician: Referring Provider: Lin Landsman Treating Provider/Extender: Skipper Cliche in Treatment: 6 Constitutional Well-nourished and well-hydrated in no acute distress. Respiratory normal breathing without difficulty. Psychiatric this patient is able to make decisions and demonstrates good insight into disease process. Alert and Oriented x 3. pleasant and cooperative. Notes I do not see any evidence of active infection at this time which is great news and overall I am extremely pleased with where he stands. With that being said I do think that the patient is indeed showing signs of improvement the wounds are measuring smaller and looking much cleaner. He is also able to walk a little bit more and he tells me with his pain medication he pretty much is pain-free he does have some discomfort when it starts to wear off or obviously when he is doing the dressing changes. Electronic Signature(s) Signed: 06/30/2020 10:34:09 AM By: Worthy Keeler PA-C Entered By: Worthy Keeler on 06/30/2020 10:34:09 Adam Christian, Adam Christian ([id number]) -------------------------------------------------------------------------------- Physician Orders Details Patient Name: Adam Christian Date of Service: 06/30/2020 9:45 AM Medical Record Number: [id number] Patient Account Number: [id number] Date of Birth/Sex: [birth year] (40 y.o. M) Treating RN: Carlene Coria Primary Care Provider: Lin Landsman Other Clinician: Referring Provider: Lin Landsman Treating Provider/Extender: Skipper Cliche in Treatment: 6 Verbal / Phone Orders: No Diagnosis Coding ICD-10  Coding Code Description L73.2 Hidradenitis suppurativa L98.492 Non-pressure chronic ulcer of skin of other sites with fat layer exposed F33.9 Major depressive disorder, recurrent, unspecified Follow-up Appointments o Return Appointment in 2 weeks. [place]: [facility] o ADMIT to [place] for wound care. May utilize formulary equivalent dressing for wound treatment orders unless otherwise specified. Home Health Nurse may visit PRN to address patientos wound care needs. - 3 times a week to assisted with dressing changes o Scheduled days for dressing changes to be completed; exception, patient has scheduled wound care visit that day. o **Please direct any NON-WOUND related issues/requests for orders to patient's Primary Care Physician. **If current dressing causes regression in wound condition, may D/C ordered dressing product/s and apply Normal Saline Moist Dressing daily until next [place] or Other MD appointment. **Notify [facility] of regression in wound condition at [phone]. Bathing/ Shower/ Hygiene o Clean wound with Normal Saline or wound cleanser. Wound Treatment Wound #1 - Gluteus Wound Laterality: Right Cleanser: [place] Ancillary Kit - 15 Day Supply (Generic) 1 x Per Day/30 Days Discharge Instructions: Use supplies as instructed; Kit contains: (15) Saline Bullets; (15) 3x3 Gauze; 15 pr Gloves Secondary Dressing: ABD Pad 5x9 (in/in) (Generic) 1 x Per Day/30 Days Discharge Instructions: Cover with ABD pad Secondary Dressing: Conforming Guaze Roll-Large (Generic) 1 x Per Day/30 Days Discharge Instructions: Apply Conforming Stretch Guaze Bandage as directed Secondary Dressing: Kerlix 4.5 x 4.1 (in/yd) (Generic) 1 x Per Day/30 Days Discharge Instructions: moistened with 1/4 strength dakins solution Secured With: 57M Medipore H Soft Cloth Surgical Tape, 2x2 (in/yd) (Generic) 1 x Per Day/30 Days Wound #2 - Gluteus Wound  Laterality: Left Cleanser: [place] Ancillary Kit - 15 Day Supply (Generic) 1 x Per Day/30 Days Discharge Instructions: Use supplies as instructed; Kit contains: (15) Saline Bullets; (15) 3x3 Gauze; 15 pr Gloves Secondary Dressing: ABD Pad 5x9 (in/in) (Generic) 1 x Per Day/30 Days Discharge Instructions:  Cover with ABD pad Secondary Dressing: Conforming Guaze Roll-Large (Generic) 1 x Per Day/30 Days Discharge Instructions: [place] as directed Secondary Dressing: Kerlix 4.5 x 4.1 (in/yd) (Generic) 1 x Per Day/30 Days Discharge Instructions: moistened with 1/4 strength dakins solution Secured With: 37M Medipore H Soft Cloth Surgical Tape, 2x2 (in/yd) (Generic) 1 x Per Day/30 Days Adam Christian, Adam Christian ([id number]) Electronic Signature(s) Signed: 06/30/2020 10:36:03 AM By: Worthy Keeler PA-C Entered By: Worthy Keeler on 06/30/2020 10:36:03 Adam Christian ([id number]) -------------------------------------------------------------------------------- Problem List Details Patient Name: Adam Christian Date of Service: 06/30/2020 9:45 AM Medical Record Number: [id number] Patient Account Number: [id number] Date of Birth/Sex: 1980-05-25 (39 y.o. M) Treating RN: Carlene Coria Primary Care Provider: Lin Landsman Other Clinician: Referring Provider: Lin Landsman Treating Provider/Extender: Skipper Cliche in Treatment: 6 Active Problems ICD-10 Encounter Code Description Active Date MDM Diagnosis L73.2 Hidradenitis suppurativa 05/18/2020 No Yes L98.492 Non-pressure chronic ulcer of skin of other sites with fat layer exposed 05/18/2020 No Yes F33.9 Major depressive disorder, recurrent, unspecified 05/18/2020 No Yes Inactive Problems Resolved Problems Electronic Signature(s) Signed: 06/30/2020 10:09:43 AM By: Worthy Keeler PA-C Entered By: Worthy Keeler on 06/30/2020 10:09:42 Adam Christian  ([id number]) -------------------------------------------------------------------------------- Progress Note Details Patient Name: Adam Christian Date of Service: 06/30/2020 9:45 AM Medical Record Number: [id number] Patient Account Number: [id number] Date of Birth/Sex: Dec 22, 1980 (39 y.o. M) Treating RN: Carlene Coria Primary Care Provider: Lin Landsman Other Clinician: Referring Provider: Lin Landsman Treating Provider/Extender: Skipper Cliche in Treatment: 6 Subjective Chief Complaint Information obtained from Patient Bilateral gluteal, perineal, and groin ulceration due to hidradenitis History of Present Illness (HPI) 05/18/2020 upon evaluation today patient appears to be doing poorly in regard to his wounds. This is actually a surgical wound at this point he has a longstanding history around 15 years of dealing with hidradenitis. Unfortunately he was getting increasingly tender and having a lot of discomfort subsequently Dr. Wilma Flavin who is the surgeon in [place] did actually take him to surgery to try to clear everything out to try to let this area to heal appropriately. Unfortunately that was not able to be done in 1 time according to what the patient tells me it seems that this is going require serial debridements. With that being said the patient apparently is a smoker and this is still good to be a significant issue here as far as that is concerned. With a skin graft or flap that would obviously help to secure things much more quickly as far as healing is concerned but again he cannot be smoking if that is to be considered. He encouraged the patient that is Dr. Wilma Flavin to stop smoking which would also help ease the progression of the disease as well. The patient has no other major medical problems other than depressive disorder but he does not seem to be having issues with that particularly right now. He tells me he is currently out of pain medication though the 5 mg  oxycodone was helping with dressing changes in the past. 05/26/2020 on evaluation today patient appears to be doing well with regard to his wounds. Honestly he is making excellent progress all things considered. These are significant wounds and I believe the Dakin's is helping to clean things up he also tells me is not having as much discomfort as it was previous. Fortunately there is no evidence of active infection at this time. 06/02/2020 upon evaluation today patient appears to be doing well with regard to his wounds.  Is been tolerating the dressing changes without complication. Fortunately there is no signs of active infection at this time. No fevers, chills, nausea, vomiting, or diarrhea. He still has not gotten the [place] solution. He does have information for how to make this himself but I think we can probably send it to a different pharmacy to get this for him. 06/30/2020 upon evaluation today patient appears to be doing excellent in regard to his wounds. Is been tolerating the dressing changes without complication. Overall the Dakin solution seems to be doing great and he in general is also doing excellent. Objective Constitutional Well-nourished and well-hydrated in no acute distress. Vitals Time Taken: 10:05 AM, Height: 66 in, Weight: 151 lbs, BMI: 24.4, Temperature: 98.3 F, Pulse: 74 bpm, Respiratory Rate: 20 breaths/min, Blood Pressure: 134/80 mmHg. Respiratory normal breathing without difficulty. Psychiatric this patient is able to make decisions and demonstrates good insight into disease process. Alert and Oriented x 3. pleasant and cooperative. General Notes: I do not see any evidence of active infection at this time which is great news and overall I am extremely pleased with where he stands. With that being said I do think that the patient is indeed showing signs of improvement the wounds are measuring smaller and looking much cleaner. He is also able to walk a little bit more  and he tells me with his pain medication he pretty much is pain-free he does have some discomfort when it starts to wear off or obviously when he is doing the dressing changes. Integumentary (Hair, Skin) Wound #1 status is Open. Original cause of wound was Surgical Injury. The date acquired was: 05/09/2020. The wound has been in treatment 6 weeks. The wound is located on the Right Gluteus. The wound measures 3.4cm length x 3.6cm width x 0.2cm depth; 9.613cm^2 area and 1.923cm^3 volume. There is Fat Layer (Subcutaneous Tissue) exposed. There is no tunneling or undermining noted. There is a large amount of purulent drainage noted. There is large (67-100%) red granulation within the wound bed. There is no necrotic tissue within the wound bed. Wound #2 status is Open. Original cause of wound was Surgical Injury. The date acquired was: 05/09/2020. The wound has been in treatment 6 [place], [place] ([id number]) weeks. The wound is located on the Left Gluteus. The wound measures 17.5cm length x 4.5cm width x 0.8cm depth; 61.85cm^2 area and 49.48cm^3 volume. There is Fat Layer (Subcutaneous Tissue) exposed. There is no tunneling or undermining noted. There is a large amount of purulent drainage noted. There is large (67-100%) red granulation within the wound bed. There is a small (1-33%) amount of necrotic tissue within the wound bed including Adherent Slough. Assessment Active Problems ICD-10 Hidradenitis suppurativa Non-pressure chronic ulcer of skin of other sites with fat layer exposed Major depressive disorder, recurrent, unspecified Plan Follow-up Appointments: Return Appointment in 2 weeks. Home Health: [place]: [facility] ADMIT to [place] for wound care. May utilize formulary equivalent dressing for wound treatment orders unless otherwise specified. Home Health Nurse may visit PRN to address patient s wound care needs. - 3 times a week to assisted with dressing  changes Scheduled days for dressing changes to be completed; exception, patient has scheduled wound care visit that day. **Please direct any NON-WOUND related issues/requests for orders to patient's Primary Care Physician. **If current dressing causes regression in wound condition, may D/C ordered dressing product/s and apply Normal Saline Moist Dressing daily until next [place] or Other MD appointment. **Notify [facility] of regression in wound condition at [phone]. Bathing/ Shower/ Hygiene: Clean wound with Normal Saline or wound cleanser. WOUND #1: - Gluteus Wound Laterality: Right Cleanser: Byram Ancillary Kit - 15 Day Supply (Generic) 1 x Per Day/30 Days Discharge Instructions: Use supplies as instructed; Kit contains: (15) Saline Bullets; (15) 3x3 Gauze; 15 pr Gloves Secondary Dressing: ABD Pad 5x9 (in/in) (Generic) 1 x Per Day/30 Days Discharge Instructions: Cover with ABD pad Secondary Dressing: Conforming Guaze Roll-Large (Generic) 1 x Per Day/30 Days Discharge Instructions: Apply Conforming Stretch Guaze Bandage as directed Secondary Dressing: Kerlix 4.5 x 4.1 (in/yd) (Generic) 1 x Per Day/30 Days Discharge Instructions: moistened with 1/4 strength dakins solution Secured With: 54M Medipore H Soft Cloth Surgical Tape, 2x2 (in/yd) (Generic) 1 x Per Day/30 Days WOUND #2: - Gluteus Wound Laterality: Left Cleanser: [place] Ancillary Kit - 15 Day Supply (Generic) 1 x Per Day/30 Days Discharge Instructions: Use supplies as instructed; Kit contains: (15) Saline Bullets; (15) 3x3 Gauze; 15 pr Gloves Secondary Dressing: ABD Pad 5x9 (in/in) (Generic) 1 x Per Day/30 Days Discharge Instructions: Cover with ABD pad Secondary Dressing: Conforming Guaze Roll-Large (Generic) 1 x Per Day/30 Days Discharge Instructions: Apply Conforming Stretch Guaze Bandage as directed Secondary Dressing: Kerlix 4.5 x 4.1 (in/yd) (Generic) 1 x Per Day/30 Days Discharge Instructions:  moistened with 1/4 strength dakins solution Secured With: 54M Medipore H Soft Cloth Surgical Tape, 2x2 (in/yd) (Generic) 1 x Per Day/30 Days 1. Would recommend currently that we going continue with the wound care measures as before and the patient is in agreement the plan that includes the use of the Dakin's moistened gauze dressings to the wound bed. He is also cleaning with the [place] Day solution. 2. I am also can recommend the patient continue to use ABD pads to cover and help secure in place he is also using a adult briefs in order to hold everything in place which is also helpful as well. We will see patient back for reevaluation in 2 weeks here in the clinic. If anything worsens or changes patient will contact our office for additional recommendations. Electronic Signature(s) Signed: 06/30/2020 10:36:19 AM By: Worthy Keeler PA-C Previous Signature: 06/30/2020 10:34:54 AM Version By: Worthy Keeler PA-C Entered By: Worthy Keeler on 06/30/2020 10:36:19 Adam Christian, Adam Christian ([id number]) -------------------------------------------------------------------------------- SuperBill Details Patient Name: Adam Christian Date of Service: 06/30/2020 Medical Record Number: [id number] Patient Account Number: [id number] Date of Birth/Sex: 04/06/1980 (39 y.o. M) Treating RN: Carlene Coria Primary Care Provider: Lin Landsman Other Clinician: Referring Provider: Lin Landsman Treating Provider/Extender: Skipper Cliche in Treatment: 6 Diagnosis Coding ICD-10 Codes Code Description L73.2 Hidradenitis suppurativa L98.492 Non-pressure chronic ulcer of skin of other sites with fat layer exposed F33.9 Major depressive disorder, recurrent, unspecified Facility Procedures CPT4 Code: [id number] Description: [id number] - WOUND CARE VISIT-LEV 5 EST PT Modifier: Quantity: 1 Physician Procedures CPT4 Code: [id number] Description: 60677 - WC PHYS LEVEL 3 - EST PT Modifier: Quantity: 1 CPT4 Code: Description: ICD-10  Diagnosis Description L73.2 Hidradenitis suppurativa L98.492 Non-pressure chronic ulcer of skin of other sites with fat layer expos F33.9 Major depressive disorder, recurrent, unspecified Modifier: ed Quantity: Electronic Signature(s) Signed: 06/30/2020 10:35:04 AM By: Worthy Keeler PA-C Entered By: Worthy Keeler on 06/30/2020 10:35:03

## 2020-07-06 NOTE — Progress Notes (Addendum)
Adam Christian, Alexsis ([id number]) Visit Report for 06/30/2020 Arrival Information Details Patient Name: Adam Christian, Adam Christian Date of Service: 06/30/2020 9:45 AM Medical Record Number: [id number] Patient Account Number: [id number] Date of Birth/Sex: September 29, 1980 (40 y.o. M) Treating RN: Yevonne PaxEpps, Carrie Primary Care Maika Kaczmarek: Leilani Ableeese, Betti Other Clinician: Referring Kalani Baray: Leilani Ableeese, Betti Treating Menaal Russum/Extender: Rowan BlaseStone, Hoyt Weeks in Treatment: 6 Visit Information History Since Last Visit Added or deleted any medications: Yes Patient Arrived: Gilmer MorCane Had a fall or experienced change in No Arrival Time: 10:07 activities of daily living that may affect Accompanied By: self risk of falls: Transfer Assistance: None Hospitalized since last visit: No Patient Identification Verified: Yes Pain Present Now: No Secondary Verification Process Completed: Yes Electronic Signature(s) Signed: 07/06/2020 4:37:07 PM By: Lolita CramBurnette, Kyara Entered By: Lolita CramBurnette, Kyara on 06/30/2020 10:09:05 Adam Christian, Adam Christian ([id number]) -------------------------------------------------------------------------------- Clinic Level of Care Assessment Details Patient Name: Adam Christian, Adam Christian Date of Service: 06/30/2020 9:45 AM Medical Record Number: [id number] Patient Account Number: [id number] Date of Birth/Sex: September 29, 1980 (39 y.o. M) Treating RN: Yevonne PaxEpps, Carrie Primary Care Carry Weesner: Leilani Ableeese, Betti Other Clinician: Referring Anthony Tamburo: Leilani Ableeese, Betti Treating Bambie Pizzolato/Extender: Rowan BlaseStone, Hoyt Weeks in Treatment: 6 Clinic Level of Care Assessment Items TOOL 4 Quantity Score X - Use when only an EandM is performed on FOLLOW-UP visit 1 0 ASSESSMENTS - Nursing Assessment / Reassessment X - Reassessment of Co-morbidities (includes updates in patient status) 1 10 X- 1 5 Reassessment of Adherence to Treatment Plan ASSESSMENTS - Wound and Skin Assessment / Reassessment []  - Simple Wound Assessment / Reassessment - one wound 0 X- 2 5 Complex Wound  Assessment / Reassessment - multiple wounds []  - 0 Dermatologic / Skin Assessment (not related to wound area) ASSESSMENTS - Focused Assessment []  - Circumferential Edema Measurements - multi extremities 0 []  - 0 Nutritional Assessment / Counseling / Intervention []  - 0 Lower Extremity Assessment (monofilament, tuning fork, pulses) []  - 0 Peripheral Arterial Disease Assessment (using hand held doppler) ASSESSMENTS - Ostomy and/or Continence Assessment and Care []  - Incontinence Assessment and Management 0 []  - 0 Ostomy Care Assessment and Management (repouching, etc.) PROCESS - Coordination of Care X - Simple Patient / Family Education for ongoing care 1 15 []  - 0 Complex (extensive) Patient / Family Education for ongoing care X- 1 10 Staff obtains ChiropractorConsents, Records, Test Results / Process Orders []  - 0 Staff telephones HHA, Nursing Homes / Clarify orders / etc []  - 0 Routine Transfer to another Facility (non-emergent condition) []  - 0 Routine Hospital Admission (non-emergent condition) []  - 0 New Admissions / Manufacturing engineernsurance Authorizations / Ordering NPWT, Apligraf, etc. []  - 0 Emergency Hospital Admission (emergent condition) X- 1 10 Simple Discharge Coordination []  - 0 Complex (extensive) Discharge Coordination PROCESS - Special Needs []  - Pediatric / Minor Patient Management 0 []  - 0 Isolation Patient Management []  - 0 Hearing / Language / Visual special needs []  - 0 Assessment of Community assistance (transportation, D/C planning, etc.) []  - 0 Additional assistance / Altered mentation []  - 0 Support Surface(s) Assessment (bed, cushion, seat, etc.) INTERVENTIONS - Wound Cleansing / Measurement Adam Christian, Adam Christian ([id number]) []  - 0 Simple Wound Cleansing - one wound X- 2 5 Complex Wound Cleansing - multiple wounds X- 1 5 Wound Imaging (photographs - any number of wounds) []  - 0 Wound Tracing (instead of photographs) []  - 0 Simple Wound Measurement - one wound X-  22 5 Complex Wound Measurement - multiple wounds INTERVENTIONS - Wound Dressings []  - Small Wound Dressing one or multiple wounds 0 []  - 0 Medium  Wound Dressing one or multiple wounds X- 2 20 Large Wound Dressing one or multiple wounds []  - 0 Application of Medications - topical []  - 0 Application of Medications - injection INTERVENTIONS - Miscellaneous []  - External ear exam 0 []  - 0 Specimen Collection (cultures, biopsies, blood, body fluids, etc.) []  - 0 Specimen(s) / Culture(s) sent or taken to Lab for analysis []  - 0 Patient Transfer (multiple staff / / Similar devices) []  - 0 Simple Staple / Suture removal (25 or less) []  - 0 Complex Staple / Suture removal (26 or more) []  - 0 Hypo / Hyperglycemic Management (close monitor of Blood Glucose) []  - 0 Ankle / Brachial Index (ABI) - do not check if billed separately X- 1 5 Vital Signs Has the patient been seen at the hospital within the last three years: Yes Total Score: 230 Level Of Care: New/Established - Level 5 Electronic Signature(s) Signed: 07/06/2020 4:37:07 PM By: Entered By: on 06/30/2020 10:29:39 (Nurse, adult) -------------------------------------------------------------------------------- Encounter Discharge Information Details Patient Name: Date of Service: 06/30/2020 9:45 AM Medical Record Number: Patient Account Number: Date of Birth/Sex: 28-Feb-1980 (39 y.o. M) Treating RN: Lolita Cram Primary Care Adolphus Hanf: 07/02/2020 Other Clinician: Referring Jonathon Castelo: Adam Christian Treating Jojuan Champney/Extender: [id number] in Treatment: 6 Encounter Discharge Information Items Discharge Condition: Stable Ambulatory Status: Cane Discharge Destination: Home Transportation: Private Auto Accompanied By: self Schedule Follow-up Appointment: Yes Clinical Summary of Care: Electronic Signature(s) Signed: 06/30/2020  3:58:37 PM By: 07/02/2020 Entered By: [id number] on 06/30/2020 10:40:08 11/12/1980 (03-04-2004) -------------------------------------------------------------------------------- Lower Extremity Assessment Details Patient Name: Adam Christian Date of Service: 06/30/2020 9:45 AM Medical Record Number: Leilani Able Patient Account Number: Rowan Blase Date of Birth/Sex: July 24, 1980 (39 y.o. M) Treating RN: Adam Christian Primary Care Giannie Soliday: 07/02/2020 Other Clinician: Referring Simisola Sandles: Adam Christian Treating Cayla Wiegand/Extender: [id number] Weeks in Treatment: 6 Electronic Signature(s) Signed: 07/03/2020 2:18:56 PM By: 07/02/2020 RN Signed: 07/06/2020 4:37:07 PM By: [id number] Entered By: 11/12/1980 on 06/30/2020 10:10:08 JYMIR, DUNAJ (Leilani Able) -------------------------------------------------------------------------------- Multi Wound Chart Details Patient Name: Leilani Able Date of Service: 06/30/2020 9:45 AM Medical Record Number: 07/05/2020 Patient Account Number: Yevonne Pax Date of Birth/Sex: 10/26/1980 (39 y.o. M) Treating RN: Lolita Cram Primary Care Genessa Beman: 07/02/2020 Other Clinician: Referring Rook Maue: Adam Christian Treating Hussain Maimone/Extender: [id number] in Treatment: 6 Vital Signs Height(in): 66 Pulse(bpm): 74 Weight(lbs): 151 Blood Pressure(mmHg): 134/80 Body Mass Index(BMI): 24 Temperature(F): 98.3 Respiratory Rate(breaths/min): 20 Photos: [N/A:N/A] Wound Location: Right Gluteus Left Gluteus N/A Wounding Event: Surgical Injury Surgical Injury N/A Primary Etiology: Open Surgical Wound Open Surgical Wound N/A Secondary Etiology: Hidradenitis Hidradenitis N/A Comorbid History: Seizure Disorder Seizure Disorder N/A Date Acquired: 05/09/2020 05/09/2020 N/A Weeks of Treatment: 6 6 N/A Wound Status: Open Open N/A Measurements L x W x D (cm) 3.4x3.6x0.2 17.5x4.5x0.8 N/A Area (cm) : 9.613 61.85 N/A Volume (cm) : 1.923 49.48 N/A %  Reduction in Area: 52.30% 69.30% N/A % Reduction in Volume: 90.50% 81.10% N/A Classification: Full Thickness Without Exposed Full Thickness Without Exposed N/A Support Structures Support Structures Exudate Amount: Large Large N/A Exudate Type: Purulent Purulent N/A Exudate Color: yellow, brown, green yellow, brown, green N/A Granulation Amount: Large (67-100%) Large (67-100%) N/A Granulation Quality: Red Red N/A Necrotic Amount: None Present (0%) Small (1-33%) N/A Exposed Structures: Fat Layer (Subcutaneous Tissue): Fat Layer (Subcutaneous Tissue): N/A Yes Yes Fascia: No Fascia: No Tendon: No Tendon: No Muscle: No Muscle: No Joint: No Joint: No  Bone: No Bone: No Epithelialization: None None N/A Treatment Notes Electronic Signature(s) Signed: 07/06/2020 4:37:07 PM By: Lolita Cram Entered By: Lolita Cram on 06/30/2020 10:28:02 Adam Christian ([id number]) -------------------------------------------------------------------------------- Multi-Disciplinary Care Plan Details Patient Name: Adam Christian Date of Service: 06/30/2020 9:45 AM Medical Record Number: [id number] Patient Account Number: [id number] Date of Birth/Sex: [birth year] (39 y.o. M) Treating RN: Yevonne Pax Primary Care Seanmichael Salmons: Leilani Able Other Clinician: Referring Ivyrose Hashman: Leilani Able Treating Jasmine Mcbeth/Extender: Rowan Blase in Treatment: 6 Active Inactive Electronic Signature(s) Signed: 08/25/2020 10:05:27 AM By: Elliot Gurney BSN, RN, CWS, Kim RN, BSN Signed: 09/08/2020 4:35:38 PM By: Yevonne Pax RN Previous Signature: 07/03/2020 2:18:56 PM Version By: Yevonne Pax RN Previous Signature: 07/06/2020 4:37:07 PM Version By: Lolita Cram Entered By: Elliot Gurney BSN, RN, CWS, Kim on 08/25/2020 10:05:27 AZZAM, MEHRA ([id number]) -------------------------------------------------------------------------------- Pain Assessment Details Patient Name: Adam Christian Date of Service: 06/30/2020 9:45  AM Medical Record Number: [id number] Patient Account Number: [id number] Date of Birth/Sex: 07-14-1980 (39 y.o. M) Treating RN: Yevonne Pax Primary Care Colum Colt: Leilani Able Other Clinician: Referring Elmor Kost: Leilani Able Treating Ariyon Mittleman/Extender: Rowan Blase in Treatment: 6 Active Problems Location of Pain Severity and Description of Pain Patient Has Paino No Site Locations Rate the pain. Current Pain Level: 0 Pain Management and Medication Current Pain Management: Electronic Signature(s) Signed: 07/03/2020 2:18:56 PM By: Yevonne Pax RN Signed: 07/06/2020 4:37:07 PM By: Lolita Cram Entered By: Lolita Cram on 06/30/2020 10:09:54 Adam Christian ([id number]) -------------------------------------------------------------------------------- Patient/Caregiver Education Details Patient Name: Adam Christian Date of Service: 06/30/2020 9:45 AM Medical Record Number: [id number] Patient Account Number: [id number] Date of Birth/Gender: [birth year] (39 y.o. M) Treating RN: Yevonne Pax Primary Care Physician: Leilani Able Other Clinician: Referring Physician: Leilani Able Treating Physician/Extender: Rowan Blase in Treatment: 6 Education Assessment Education Provided To: Patient Education Topics Provided Wound/Skin Impairment: Methods: Explain/Verbal Responses: State content correctly Electronic Signature(s) Signed: 07/06/2020 4:37:07 PM By: Lolita Cram Entered By: Lolita Cram on 06/30/2020 10:30:27 Adam Christian ([id number]) -------------------------------------------------------------------------------- Wound Assessment Details Patient Name: Adam Christian Date of Service: 06/30/2020 9:45 AM Medical Record Number: [id number] Patient Account Number: [id number] Date of Birth/Sex: 08-May-1980 (39 y.o. M) Treating RN: Yevonne Pax Primary Care Glendora Clouatre: Leilani Able Other Clinician: Referring Cristan Hout: Leilani Able Treating Jearldine Cassady/Extender: Allen Derry Weeks in Treatment: 6 Wound Status Wound Number: 1 Primary Etiology: Open Surgical Wound Wound Location: Right Gluteus Secondary Etiology: Hidradenitis Wounding Event: Surgical Injury Wound Status: Open Date Acquired: 05/09/2020 Comorbid History: Seizure Disorder Weeks Of Treatment: 6 Clustered Wound: No Photos Wound Measurements Length: (cm) 3.4 Width: (cm) 3.6 Depth: (cm) 0.2 Area: (cm) 9.613 Volume: (cm) 1.923 % Reduction in Area: 52.3% % Reduction in Volume: 90.5% Epithelialization: None Tunneling: No Undermining: No Wound Description Classification: Full Thickness Without Exposed Support Structu Exudate Amount: Large Exudate Type: Purulent Exudate Color: yellow, brown, green res Foul Odor After Cleansing: No Slough/Fibrino No Wound Bed Granulation Amount: Large (67-100%) Exposed Structure Granulation Quality: Red Fascia Exposed: No Necrotic Amount: None Present (0%) Fat Layer (Subcutaneous Tissue) Exposed: Yes Tendon Exposed: No Muscle Exposed: No Joint Exposed: No Bone Exposed: No Electronic Signature(s) Signed: 07/03/2020 2:18:56 PM By: Yevonne Pax RN Signed: 07/06/2020 4:37:07 PM By: Lolita Cram Entered By: Lolita Cram on 06/30/2020 10:20:34 Adam Christian ([id number]) -------------------------------------------------------------------------------- Wound Assessment Details Patient Name: Adam Christian Date of Service: 06/30/2020 9:45 AM Medical Record Number: [id number] Patient Account Number: [id number] Date of Birth/Sex: Jun 09, 1980 (39 y.o. M) Treating RN: Yevonne Pax Primary Care Artez Regis: Leilani Able Other Clinician: Referring Jiraiya Mcewan: Leilani Able Treating Rossanna Spitzley/Extender: Larina Bras,  Hoyt Weeks in Treatment: 6 Wound Status Wound Number: 2 Primary Etiology: Open Surgical Wound Wound Location: Left Gluteus Secondary Etiology: Hidradenitis Wounding Event: Surgical Injury Wound Status: Open Date Acquired: 05/09/2020 Comorbid  History: Seizure Disorder Weeks Of Treatment: 6 Clustered Wound: No Photos Wound Measurements Length: (cm) 17.5 Width: (cm) 4.5 Depth: (cm) 0.8 Area: (cm) 61.85 Volume: (cm) 49.48 % Reduction in Area: 69.3% % Reduction in Volume: 81.1% Epithelialization: None Tunneling: No Undermining: No Wound Description Classification: Full Thickness Without Exposed Support Structu Exudate Amount: Large Exudate Type: Purulent Exudate Color: yellow, brown, green res Foul Odor After Cleansing: No Slough/Fibrino Yes Wound Bed Granulation Amount: Large (67-100%) Exposed Structure Granulation Quality: Red Fascia Exposed: No Necrotic Amount: Small (1-33%) Fat Layer (Subcutaneous Tissue) Exposed: Yes Necrotic Quality: Adherent Slough Tendon Exposed: No Muscle Exposed: No Joint Exposed: No Bone Exposed: No Electronic Signature(s) Signed: 07/03/2020 2:18:56 PM By: Yevonne Pax RN Signed: 07/06/2020 4:37:07 PM By: Lolita Cram Entered By: Lolita Cram on 06/30/2020 10:21:07 Adam Christian ([id number]) -------------------------------------------------------------------------------- Vitals Details Patient Name: Adam Christian Date of Service: 06/30/2020 9:45 AM Medical Record Number: [id number] Patient Account Number: [id number] Date of Birth/Sex: 09/20/1980 (39 y.o. M) Treating RN: Yevonne Pax Primary Care Kla Bily: Leilani Able Other Clinician: Referring Etha Stambaugh: Leilani Able Treating Dudley Cooley/Extender: Rowan Blase in Treatment: 6 Vital Signs Time Taken: 10:05 Temperature (F): 98.3 Height (in): 66 Pulse (bpm): 74 Weight (lbs): 151 Respiratory Rate (breaths/min): 20 Body Mass Index (BMI): 24.4 Blood Pressure (mmHg): 134/80 Reference Range: 80 - 120 mg / dl Electronic Signature(s) Signed: 07/06/2020 4:37:07 PM By: Lolita Cram Entered By: Lolita Cram on 06/30/2020 10:09:47

## 2020-07-13 ENCOUNTER — Ambulatory Visit: Payer: Self-pay | Admitting: Surgery

## 2020-07-13 NOTE — H&P (Signed)
Adam Christian Appointment: 07/13/2020 4:20 PM Location: [facility] Patient #: [id number] DOB: [birth year] Single / Language: [name] / Race: Black or African American Male  History of Present Illness [name] Daily MD; 07/13/2020 4:30 PM) Patient words: Patient returns for evaluation of his hidradenitis involving both proximal, perineum and left thigh. The areas excision the left are healing well. He still has some buttocks on the left and multiple sinus tracts in the midline and on the right. These appear stable with no signs of any invasive infection today. He is to follow up will be out for some time due to the complexity of situation.  The patient is a 40 year old male.    Physical Exam (Ronell Boldin A. Carrera Kiesel MD; 07/13/2020 4:32 PM)  Integumentary Note: Left buttock is full and there is a 4 x 8 cm region along the left block toward a left lower buttock wound is clean. Right buttock wound is clean. Each measures 4 x 6 cm. Left inner thigh wound is almost completely healed. Multiple sinus tracts with no obvious acute infection or superinfection noted today.  Chest and Lung Exam Note: lungs cta  Cardiovascular Note: NSR  Neurologic Neurologic evaluation reveals -alert and oriented x 3 with no impairment of recent or remote memory. Mental Status-Normal.  Musculoskeletal Normal Exam - Left-Upper Extremity Strength Normal and Lower Extremity Strength Normal. Normal Exam - Right-Upper Extremity Strength Normal, Lower Extremity Weakness.    Assessment & Plan (Freemon Binford A. Burnice Oestreicher MD; 07/13/2020 4:33 PM)  POST-OPERATIVE STATE [phone]) Impression: Overall doing well He appears stable for further surgery to excise more hidradenitis. No active infection noted today.   He is on Lyrica that seems to control his pain well. I will refill his prescription but place a multitude milligrams twice a day for that. We will try to keep multiple narcotics and he's been pretty good  about his pain medication. He is ready for further excision of his hidradenitis and we'll focus on both buttocks. He does have some sparse disease involving the midline just above the gluteal cleft. Risks and benefits of surgery discussed. Further wound care discussed. He'll continue with home health but is feeling pretty good about that. He'll see his PCP for further medical management of his other issues.  Refill clindamycin 150 mg by mouth 3 times a day for now Continuing care  Post for excision about hidradenitis. Risk of bleeding, infection, wound healing issues, recurrence, consequences of continued tobacco abuse, organ injury, anesthesia risks, any further treatments and/or procedures.  Current Plans Started Lyrica 50 MG Oral Capsule, 1 (one) Capsule two times daily, #28, 07/13/2020, No Refill. Continued Clindamycin HCl 150 MG Oral Capsule, 1 (one) Capsule three times daily, #21, 07/13/2020, Ref. x3. Pt Education - [facility] Free Text Education/Instructions: discussed with patient and provided information.

## 2020-07-14 ENCOUNTER — Ambulatory Visit: Payer: BC Managed Care – PPO | Admitting: Physician Assistant

## 2020-07-21 ENCOUNTER — Ambulatory Visit: Payer: BC Managed Care – PPO | Admitting: Physician Assistant

## 2020-08-22 SURGERY — Surgical Case
Anesthesia: *Unknown

## 2022-08-16 ENCOUNTER — Ambulatory Visit
Admission: EM | Admit: 2022-08-16 | Discharge: 2022-08-16 | Disposition: A | Discharge status: Home / Self Care | Payer: BC Managed Care – PPO

## 2022-08-16 ENCOUNTER — Encounter (HOSPITAL_COMMUNITY): Payer: Self-pay

## 2022-08-16 ENCOUNTER — Emergency Department (HOSPITAL_COMMUNITY)
Admission: EM | Admit: 2022-08-16 | Discharge: 2022-08-16 | Disposition: A | Discharge status: Home / Self Care | Payer: BC Managed Care – PPO | Attending: Emergency Medicine | Admitting: Emergency Medicine

## 2022-08-16 ENCOUNTER — Other Ambulatory Visit: Payer: Self-pay

## 2022-08-16 ENCOUNTER — Emergency Department (HOSPITAL_BASED_OUTPATIENT_CLINIC_OR_DEPARTMENT_OTHER)
Admit: 2022-08-16 | Discharge: 2022-08-16 | Disposition: A | Discharge status: Home / Self Care | Payer: BC Managed Care – PPO | Attending: Emergency Medicine | Admitting: Emergency Medicine

## 2022-08-16 DIAGNOSIS — Z7901 Long term (current) use of anticoagulants: Secondary | ICD-10-CM | POA: Diagnosis not present

## 2022-08-16 DIAGNOSIS — R202 Paresthesia of skin: Secondary | ICD-10-CM | POA: Diagnosis present

## 2022-08-16 DIAGNOSIS — I82402 Acute embolism and thrombosis of unspecified deep veins of left lower extremity: Secondary | ICD-10-CM | POA: Diagnosis not present

## 2022-08-16 DIAGNOSIS — M79605 Pain in left leg: Secondary | ICD-10-CM

## 2022-08-16 DIAGNOSIS — M79662 Pain in left lower leg: Secondary | ICD-10-CM | POA: Diagnosis not present

## 2022-08-16 LAB — BASIC METABOLIC PANEL
Anion gap: 8 (ref 5–15)
BUN: 11 mg/dL (ref 6–20)
CO2: 24 mmol/L (ref 22–32)
Calcium: 8.7 mg/dL — ABNORMAL LOW (ref 8.9–10.3)
Chloride: 105 mmol/L (ref 98–111)
Creatinine, Ser: 0.97 mg/dL (ref 0.61–1.24)
GFR, Estimated: >60 mL/min (ref >60)
Glucose, Bld: 94 mg/dL (ref 70–99)
Potassium: 4 mmol/L (ref 3.5–5.1)
Sodium: 137 mmol/L (ref 135–145)

## 2022-08-16 LAB — CBC
HCT: 39.6 % (ref 39.0–52.0)
Hemoglobin: 12.2 g/dL — ABNORMAL LOW (ref 13.0–17.0)
MCH: 25.3 pg — ABNORMAL LOW (ref 26.0–34.0)
MCHC: 30.8 g/dL (ref 30.0–36.0)
MCV: 82.2 fL (ref 80.0–100.0)
Platelets: 289 10*3/uL (ref 150–400)
RBC: 4.82 MIL/uL (ref 4.22–5.81)
RDW: 15.1 % (ref 11.5–15.5)
WBC: 10.3 10*3/uL (ref 4.0–10.5)
nRBC: 0 % (ref 0.0–0.2)

## 2022-08-16 MED ORDER — RIVAROXABAN 15 MG PO TABS
15.0000 mg | ORAL_TABLET | Freq: Once | ORAL | Status: AC
  Administered 2022-08-16: 15 mg via ORAL
  Filled 2022-08-16: qty 1

## 2022-08-16 MED ORDER — RIVAROXABAN (XARELTO) VTE STARTER PACK (15 & 20 MG): ORAL_TABLET | ORAL | 0 refills | Status: DC

## 2022-08-16 NOTE — ED Notes (Signed)
Patient is being discharged from the Urgent Care and sent to the Emergency Department via personal vehicle. Per Verlon Au, [place] patient is in need of higher level of care to rule out a blood clot. Patient is aware and verbalizes understanding of plan of care.  Vitals:   08/16/22 1404  BP: 134/83  Pulse: 64  Resp: 18  Temp: 98.1 F (36.7 C)  SpO2: 97%

## 2022-08-16 NOTE — ED Triage Notes (Signed)
"  I woke up this morning with left inguinal pain and now pain has extending down leg and the whole things hurt". No injury. No dysuria. No urinary problems or concerns.

## 2022-08-16 NOTE — ED Triage Notes (Addendum)
Pt arrived POV. Yesterday pt had L leg pain and went to mall today and leg still hurting. Pt reports swelling to L leg. Only pain is in L groin. UC sent pt here for DVT study.  Pt hx of DVT.

## 2022-08-16 NOTE — Discharge Instructions (Addendum)
-  Elevate your leg to help with the swelling  -Use compression stockings    _____________________________________________________________________________________________________________________________________ Information on my medicine - XARELTO (rivaroxaban)  This medication education was reviewed with me or my healthcare representative as part of my discharge preparation.  The pharmacist that spoke with me during my hospital stay was:  Doristine Counter, [facility]  WHY WAS XARELTO PRESCRIBED FOR YOU? Xarelto was prescribed to treat blood clots that may have been found in the veins of your legs (deep vein thrombosis) or in your lungs (pulmonary embolism) and to reduce the risk of them occurring again.  What do you need to know about Xarelto? The starting dose is one 15 mg tablet taken TWICE daily with food for the FIRST 21 DAYS then on 7/26  the dose is changed to one 20 mg tablet taken ONCE A DAY with your evening meal.  DO NOT stop taking Xarelto without talking to the health care provider who prescribed the medication.  Refill your prescription for 20 mg tablets before you run out.  After discharge, you should have regular check-up appointments with your healthcare provider that is prescribing your Xarelto.  In the future your dose may need to be changed if your kidney function changes by a significant amount.  What do you do if you miss a dose? If you are taking Xarelto TWICE DAILY and you miss a dose, take it as soon as you remember. You may take two 15 mg tablets (total 30 mg) at the same time then resume your regularly scheduled 15 mg twice daily the next day.  If you are taking Xarelto ONCE DAILY and you miss a dose, take it as soon as you remember on the same day then continue your regularly scheduled once daily regimen the next day. Do not take two doses of Xarelto at the same time.   Important Safety Information Xarelto is a blood thinner medicine that can cause bleeding. You  should call your healthcare provider right away if you experience any of the following: Bleeding from an injury or your nose that does not stop. Unusual colored urine (red or dark brown) or unusual colored stools (red or black). Unusual bruising for unknown reasons. A serious fall or if you hit your head (even if there is no bleeding).  Some medicines may interact with Xarelto and might increase your risk of bleeding while on Xarelto. To help avoid this, consult your healthcare provider or pharmacist prior to using any new prescription or non-prescription medications, including herbals, vitamins, non-steroidal anti-inflammatory drugs (NSAIDs) and supplements.  This website has more information on Xarelto: VisitDestination.com.br.

## 2022-08-16 NOTE — Progress Notes (Signed)
Left lower extremity venous duplex has been completed. Preliminary results can be found in CV Proc through chart review.  Results were given to Lorin Roemhildt PA.  08/16/22 5:57 PM Olen Cordial RVT

## 2022-08-16 NOTE — ED Provider Notes (Signed)
[place] EMERGENCY DEPARTMENT AT [facility] Provider Note   CSN: [id number] Arrival date & time: 08/16/22  1535    History Chief Complaint  Patient presents with   Possible DVT of L Leg    HPI Dilson Mally is a 42 y.o. male presenting for pain that started yesterday. It was dull and it would go away if he drank alcohol. The pain worsened and his leg started to swell so he is now here for further evaluation. Has a history of a DVT. He is starting to feel some tingling over the medial leg but otherwise does not have other motor deficits.   Patient's recorded medical, surgical, social, medication list and allergies were reviewed in the Snapshot window as part of the initial history.   Review of Systems   Review of Systems  Respiratory:  Negative for chest tightness and shortness of breath.   Skin:  Negative for pallor and rash.  Neurological:  Positive for numbness. Negative for weakness.    Physical Exam Updated Vital Signs BP 137/88 (BP Location: Right Arm)   Pulse (!) 59   Temp 98.1 F (36.7 C) (Oral)   Resp 18   Ht 5\' 6"  (1.676 m)   Wt 77.1 kg   SpO2 100%   BMI 27.44 kg/m  Physical Exam Constitutional:      General: He is not in acute distress. Cardiovascular:     Comments: DP pulses  Musculoskeletal:        General: Swelling (R leg) and tenderness (groin) present.  Skin:    General: Skin is warm.     Capillary Refill: Capillary refill takes less than 2 seconds.  Neurological:     Mental Status: He is alert and oriented to person, place, and time.     Sensory: No sensory deficit.     Motor: No weakness.    ED Course/ Medical Decision Making/ A&P    Medications Ordered in ED Medications  Rivaroxaban (XARELTO) tablet 15 mg (15 mg Oral Given 08/16/22 1953)    Medical Decision Making:    Rushawn Polley is a 42 y.o. male who presented to the ED today with concerns for a DVT after being seen at Urgent Care.   Complete initial physical exam  performed, notably the patient had a swollen and tender R leg, DP pulses present, warm to palpation, able to move but having numbness over the leg.      Reviewed and confirmed nursing documentation for past medical history, family history, social history.    Initial Plan:  US Doppler BLE reviewed.  Screening labs including CBC and Metabolic panel to evaluate for infectious or metabolic etiology of disease.  Objective evaluation as below reviewed with plan for close reassessment  Initial Study Results:   Laboratory  All laboratory results reviewed without evidence of clinically relevant pathology.   Results for orders placed or performed during the hospital encounter of 08/16/22  CBC  Result Value Ref Range   WBC 10.3 4.0 - 10.5 K/uL   RBC 4.82 4.22 - 5.81 MIL/uL   Hemoglobin 12.2 (L) 13.0 - 17.0 g/dL   HCT 78.2 95.6 - 21.3 %   MCV 82.2 80.0 - 100.0 fL   MCH 25.3 (L) 26.0 - 34.0 pg   MCHC 30.8 30.0 - 36.0 g/dL   RDW 08.6 57.8 - 46.9 %   Platelets 289 150 - 400 K/uL   nRBC 0.0 0.0 - 0.2 %  Basic metabolic panel  Result Value Ref Range  Sodium 137 135 - 145 mmol/L   Potassium 4.0 3.5 - 5.1 mmol/L   Chloride 105 98 - 111 mmol/L   CO2 24 22 - 32 mmol/L   Glucose, Bld 94 70 - 99 mg/dL   BUN 11 6 - 20 mg/dL   Creatinine, Ser 7.84 0.61 - 1.24 mg/dL   Calcium 8.7 (L) 8.9 - 10.3 mg/dL   GFR, Estimated >69 >62 mL/min   Anion gap 8 5 - 15   VAS [place] LOWER EXTREMITY VENOUS (DVT) (7a-7p)  Result Date: 08/16/2022  Lower Venous DVT Study Patient Name:  BEAU IRAHETA  Date of Exam:   08/16/2022 Medical Rec #: [id number]        Accession #:    [id number] Date of Birth: 03-17-1980        Patient Gender: M Patient Age:   34 years Exam Location:  [facility] Procedure:      VAS [place] LOWER EXTREMITY VENOUS (DVT) Referring Phys: Griffin Basil ROEMHILDT --------------------------------------------------------------------------------  Indications: Pain.  Risk Factors: None identified. Comparison Study: No  prior studies. Performing Technologist: Chanda Busing RVT  Examination Guidelines: A complete evaluation includes B-mode imaging, spectral Doppler, color Doppler, and power Doppler as needed of all accessible portions of each vessel. Bilateral testing is considered an integral part of a complete examination. Limited examinations for reoccurring indications may be performed as noted. The reflux portion of the exam is performed with the patient in reverse Trendelenburg.  +-----+---------------+---------+-----------+----------+--------------+ RIGHTCompressibilityPhasicitySpontaneityPropertiesThrombus Aging +-----+---------------+---------+-----------+----------+--------------+ CFV  Full           Yes      Yes                                 +-----+---------------+---------+-----------+----------+--------------+   +---------+---------------+---------+-----------+----------+--------------+ LEFT     CompressibilityPhasicitySpontaneityPropertiesThrombus Aging +---------+---------------+---------+-----------+----------+--------------+ CFV      None           No       No                   Acute          +---------+---------------+---------+-----------+----------+--------------+ FV Prox  None           No       No                   Acute          +---------+---------------+---------+-----------+----------+--------------+ FV Mid   Full                                                        +---------+---------------+---------+-----------+----------+--------------+ FV DistalNone           No       No                   Acute          +---------+---------------+---------+-----------+----------+--------------+ PFV      None           No       No                   Acute          +---------+---------------+---------+-----------+----------+--------------+ POP      None  No       No                   Acute           +---------+---------------+---------+-----------+----------+--------------+ PTV      Full                                                        +---------+---------------+---------+-----------+----------+--------------+ PERO     Partial                                                     +---------+---------------+---------+-----------+----------+--------------+ EIV                     Yes      Yes                                 +---------+---------------+---------+-----------+----------+--------------+     Summary: RIGHT: - No evidence of common femoral vein obstruction.  LEFT: - Findings consistent with acute deep vein thrombosis involving the left common femoral vein, left femoral vein, left proximal profunda vein, left popliteal vein, and left peroneal veins. - No cystic structure found in the popliteal fossa.  *See table(s) above for measurements and observations.    Preliminary      Reassessment and Plan:    Quadry Menor is a 42 y.o. with a PMH significant for a prior DVT on the L leg, epileptic seizures during childhood, kidney donation, and hidradenitis suppurativa s/p plastic surgery x1 over the perineal region. He comes in today with a swollen leg and US doppler concerning for DVT of the LL extremity.   DVT  -Xarelto  -Compression stockings  -Elevation of the leg  -Referred to Ambulatory Referral DVT outpatient clinic -Work letter given  -Crutches  Clinical Impression:  1. Acute deep vein thrombosis (DVT) of left lower extremity, unspecified vein (HCC)      Discharge   Final Clinical Impression(s) / ED Diagnoses Final diagnoses:  Acute deep vein thrombosis (DVT) of left lower extremity, unspecified vein (HCC)    Rx / DC Orders ED Discharge Orders          Ordered    AMB Referral to Deep Vein Thrombosis Clinic       Comments: Please call [phone] with any questions.   08/16/22 1938    RIVAROXABAN (XARELTO) VTE STARTER PACK (15 & 20 MG)  Multiple  Frequencies        08/16/22 1950    Crutches        08/16/22 1950              Hassan Rowan, [place], MD 08/16/22 2025    Gwyneth Sprout, MD 08/16/22 2134

## 2022-08-16 NOTE — ED Provider Notes (Signed)
[facility]    CSN: [id number] Arrival date & time: 08/16/22  1252      History   Chief Complaint Chief Complaint  Patient presents with   Leg Problem    Left    HPI Adam Christian is a 42 y.o. male.   Patient complains of swelling in his left leg patient reports he began having pain in his groin area yesterday today he noticed that his left leg is larger than his right patient has had a DVT in the past.  Patient reports he had a blood clot when he was 16  The history is provided by the patient. No language interpreter was used.    Past Medical History:  Diagnosis Date   Cyst 42 years old was first occurance   Pt has multiple cysts: L axila, groing, gluteous, scrotum   Depression    Hydradenitis    Peripheral vascular disease (HCC)    right leg blood clot   Seizures (HCC)    as a child    Patient Active Problem List   Diagnosis Date Noted   Hydradenitis 05/05/2013   Depression, major 12/24/2010   DVT 03/10/2007    Past Surgical History:  Procedure Laterality Date   boil     HYDRADENITIS EXCISION N/A 05/09/2020   Procedure: DEBRIDEMENT OF RIGHT GROIN AND LEFT BUTTOCK HIDRADENITIS;  Surgeon: Harriette Bouillon, MD;  Location: [facility] OR;  Service: General;  Laterality: N/A;   KIDNEY DONATION     KIDNEY SURGERY  March 29, 2003   patient gave mother one of his kidneys   TONSILLECTOMY     TONSILLECTOMY         Home Medications    Prior to Admission medications   Medication Sig Start Date End Date Taking? Authorizing Provider  naproxen (NAPROSYN) 500 MG tablet Take 500 mg by mouth 2 (two) times daily with a meal. 09/12/20  Yes [provider]  sodium chloride irrigation 0.9 % irrigation Irrigate with 10 mLs as directed once. 09/28/20  Yes [provider]  traMADol (ULTRAM) 50 MG tablet Take 50 mg by mouth every 6 (six) hours as needed for moderate pain or severe pain. 09/12/20  Yes [provider]  acetaminophen (TYLENOL) 500 MG  tablet Take 1,000 mg by mouth every 4 (four) hours as needed for moderate pain.    [provider]  clindamycin (CLEOCIN) 150 MG capsule Take 150 mg by mouth 3 (three) times daily. 04/12/20   [provider]  ibuprofen (ADVIL) 800 MG tablet Take 1 tablet (800 mg total) by mouth every 8 (eight) hours as needed. 05/09/20   Cornett, Maisie Fus, MD  oxyCODONE (OXY IR/ROXICODONE) 5 MG immediate release tablet Take 1 tablet (5 mg total) by mouth every 6 (six) hours as needed for severe pain. 05/09/20   Cornett, Maisie Fus, MD  traMADol (ULTRAM) 50 MG tablet Take 50 mg by mouth every 6 (six) hours as needed. Have run out of this    [provider]    Family History Family History  Problem Relation Age of Onset   Kidney failure Mother    Hypertension Mother    Hypertension Sister     Social History Social History   Tobacco Use   Smoking status: Some Days    Packs/day: .5    Types: Cigarettes    Last attempt to quit: 04/27/2020    Years since quitting: 2.3   Smokeless tobacco: Never  Vaping Use   Vaping Use: Never used  Substance Use Topics   Alcohol use: Yes    Comment: occasionally   Drug use: Yes    Frequency: 14.0 times per week    Types: Marijuana    Comment: occasionally     Allergies   Iodine and Shrimp [shellfish allergy]   Review of Systems Review of Systems  All other systems reviewed and are negative.    Physical Exam Triage Vital Signs ED Triage Vitals  Enc Vitals Group     BP 08/16/22 1404 134/83     Pulse Rate 08/16/22 1404 64     Resp 08/16/22 1404 18     Temp 08/16/22 1404 98.1 F (36.7 C)     Temp Source 08/16/22 1404 Oral     SpO2 08/16/22 1404 97 %     Weight 08/16/22 1402 170 lb (77.1 kg)     Height 08/16/22 1402 5\' 6"  (1.676 m)     Head Circumference --      Peak Flow --      Pain Score 08/16/22 1401 5     Pain Loc --      Pain Edu? --      Excl. in GC? --    No data found.  Updated Vital Signs BP 134/83 (BP Location:  Left Arm)   Pulse 64   Temp 98.1 F (36.7 C) (Oral)   Resp 18   Ht 5\' 6"  (1.676 m)   Wt 77.1 kg   SpO2 97%   BMI 27.44 kg/m   Visual Acuity Right Eye Distance:   Left Eye Distance:   Bilateral Distance:    Right Eye Near:   Left Eye Near:    Bilateral Near:     Physical Exam Vitals and nursing note reviewed.  Constitutional:      Appearance: He is well-developed.  HENT:     Head: Normocephalic.  Pulmonary:     Effort: Pulmonary effort is normal.  Abdominal:     General: There is no distension.  Musculoskeletal:        General: Tenderness present.     Cervical back: Normal range of motion.     Comments: Left calf left lower leg, tender left inguinal area full range of motion of leg neurovascular neurosensory are intact  Skin:    General: Skin is warm.  Neurological:     Mental Status: He is alert and oriented to person, place, and time.      UC Treatments / Results  Labs (all labs ordered are listed, but only abnormal results are displayed) Labs Reviewed - No data to display  EKG   Radiology No results found.  Procedures Procedures (including critical care time)  Medications Ordered in UC Medications - No data to display  Initial Impression / Assessment and Plan / UC Course  I have reviewed the triage vital signs and the nursing notes.  Pertinent labs & imaging results that were available during my care of the patient were reviewed by me and considered in my medical decision making (see chart for details).     And is high risk for DVT patient is advised to go to the emergency department for blood work and ultrasound evaluation Final Clinical Impressions(s) / UC Diagnoses   Final diagnoses:  Leg pain, left     Discharge Instructions      Go to the Emergency department to be seen for evaltuion   ED Prescriptions   None    PDMP not reviewed this encounter. An After Visit  Summary was printed and given to the patient.       Elson Areas, [place] 08/16/22 1436

## 2022-08-16 NOTE — Discharge Instructions (Signed)
Go to the Emergency department to be seen for evaltuion  

## 2022-08-21 ENCOUNTER — Other Ambulatory Visit (HOSPITAL_COMMUNITY): Payer: Self-pay

## 2022-08-22 ENCOUNTER — Encounter (HOSPITAL_COMMUNITY)
Admission: RE | Admit: 2022-08-22 | Discharge: 2022-08-22 | Disposition: A | Discharge status: Home / Self Care | Payer: BC Managed Care – PPO | Source: Ambulatory Visit | Attending: Vascular Surgery | Admitting: Vascular Surgery

## 2022-08-22 ENCOUNTER — Encounter (HOSPITAL_COMMUNITY): Payer: Self-pay

## 2022-08-22 VITALS — BP 122/83 | HR 99

## 2022-08-22 DIAGNOSIS — I82412 Acute embolism and thrombosis of left femoral vein: Secondary | ICD-10-CM | POA: Insufficient documentation

## 2022-08-22 MED ORDER — RIVAROXABAN 20 MG PO TABS: 20.0000 mg | ORAL_TABLET | Freq: Every day | ORAL | 1 refills | Status: AC

## 2022-08-22 NOTE — Progress Notes (Signed)
DVT Clinic Note  Name: Adam Christian     MRN: [id number]     DOB: [birth year]     Sex: male  PCP: Leilani Able, MD  Today's Visit: Visit Information: Initial Visit  Referred to DVT Clinic by: Dr. Anitra Lauth [facility]) Referred to CPP by: Dr. Chestine Spore Reason for referral:  Chief Complaint  Patient presents with   DVT   HISTORY OF PRESENT ILLNESS: Adam Christian is a 42 y.o. male who presents after diagnosis of DVT for medication management. 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 Patient has a history of left femoral vein DVT in 2008. He was enrolled in the EINSTEIN research trial and received rivaroxaban for 3 months. He denies any provoking risk factors found at that time. Patient presented to urgent care and was then transferred to the ED on 08/16/22 with LLE pain and swelling extending from the calf up to the groin. DVT study in the ED showed acute DVT extending from the left common femoral vein to the peroneal vein. He was started on anticoagulation with Xarelto and referred to the DVT Clinic for outpatient follow up.   Today, he reports that his pain and swelling have significant improved since starting Xarelto a few days ago. Reports pain today as 2/10. Only having mild pain in the groin, no pain below the knee. Reports that his leg was very swollen in the ED and he feels that it's already gone down to nearly the same as his other leg. He's been elevating his leg daily and plans to purchase compression stockings. He works a very active job at [employer]. He has been taking Xarelto with food. No bleeding or bruising. No chest pain, SOB, palpitations. Endorses history of DVTs in his sister and on his dad's side. Patient does not currently follow with a PCP.  Positive Thrombotic Risk Factors: Previous VTE Bleeding Risk Factors: Anticoagulant therapy  Negative Thrombotic Risk Factors: Recent surgery (within 3 months), Recent trauma (within 3 months),  Recent admission to hospital with acute illness (within 3 months), Active cancer, Older age, Obesity, Estrogen therapy, Testosterone therapy, Pregnancy, Central venous catheterization, Sedentary journey lasting >8 hours within 4 weeks, Paralysis, paresis, or recent plaster cast immobilization of lower extremity, Within 6 weeks postpartum, Recent cesarean section (within 3 months), Erythropoiesis-stimulating agent, Bed rest >72 hours within 3 months, Known thrombophilic condition, Recent COVID diagnosis (within 3 months), Non-malignant, chronic inflammatory condition, Smoking  Rx Insurance Coverage: Commercial Rx Affordability: Xarelto copay is $5/month Preferred Pharmacy: Refills sent to [facility] on [address]  Past Medical History:  Diagnosis Date   Cyst 42 years old was first occurance   Pt has multiple cysts: L axila, groing, gluteous, scrotum   Depression    Hydradenitis    Peripheral vascular disease (HCC)    right leg blood clot   Seizures (HCC)    as a child    Past Surgical History:  Procedure Laterality Date   boil     HYDRADENITIS EXCISION N/A 05/09/2020   Procedure: DEBRIDEMENT OF RIGHT GROIN AND LEFT BUTTOCK HIDRADENITIS;  Surgeon: Harriette Bouillon, MD;  Location: [facility] OR;  Service: General;  Laterality: N/A;   KIDNEY DONATION     KIDNEY SURGERY  March 29, 2003   patient gave mother one of his kidneys   TONSILLECTOMY     TONSILLECTOMY      Social History   Socioeconomic History   Marital status: Single    Spouse name: Not on file  Number of children: Not on file   Years of education: Not on file   Highest education level: Not on file  Occupational History   Not on file  Tobacco Use   Smoking status: Some Days    Current packs/day: 0.00    Types: Cigarettes    Last attempt to quit: 04/27/2020    Years since quitting: 2.3   Smokeless tobacco: Never  Vaping Use   Vaping status: Never Used  Substance and Sexual Activity   Alcohol use: Yes    Comment:  occasionally   Drug use: Yes    Frequency: 14.0 times per week    Types: Marijuana    Comment: occasionally   Sexual activity: Yes    Birth control/protection: Condom  Other Topics Concern   Not on file  Social History Narrative   Not on file   Social Determinants of Health   Financial Resource Strain: Not on file  Food Insecurity: Not on file  Transportation Needs: Not on file  Physical Activity: Not on file  Stress: Not on file  Social Connections: Not on file  Intimate Partner Violence: Not on file    Family History  Problem Relation Age of Onset   Kidney failure Mother    Hypertension Mother    Hypertension Sister     Allergies as of 08/22/2022 - Review Complete 08/22/2022  Allergen Reaction Noted   Iodine Anaphylaxis 12/19/2010   Shrimp [shellfish allergy] Anaphylaxis 12/19/2010    Current Outpatient Medications on File Prior to Encounter  Medication Sig Dispense Refill   RIVAROXABAN (XARELTO) VTE STARTER PACK (15 & 20 MG) Take 15 mg by mouth in the morning and at bedtime for 21 days, THEN 20 mg daily. Follow package directions: Take one 15mg  tablet by mouth twice a day. On day 22, switch to one 20mg  tablet once a day. Take with food.. 51 each 0   No current facility-administered medications on file prior to encounter.   REVIEW OF SYSTEMS:  Review of Systems  Respiratory:  Negative for shortness of breath.   Cardiovascular:  Negative for chest pain, palpitations and leg swelling.  Musculoskeletal:  Negative for myalgias.  Neurological:  Negative for dizziness and tingling.   PHYSICAL EXAMINATION:  Vitals:   08/22/22 1344  BP: 122/83  Pulse: 99  SpO2: 90%   Physical Exam Vitals reviewed.  Cardiovascular:     Rate and Rhythm: Normal rate.  Pulmonary:     Effort: Pulmonary effort is normal.  Musculoskeletal:        General: No tenderness.     Right lower leg: No edema.     Left lower leg: Edema (mild, nonpitting edema) present.  Skin:    Findings: No  bruising or erythema.  Psychiatric:        Mood and Affect: Mood normal.        Behavior: Behavior normal.        Thought Content: Thought content normal.   Villalta Score for Post-Thrombotic Syndrome: Pain: Mild Cramps: Absent Heaviness: Absent Paresthesia: Absent Pruritus: Absent Pretibial Edema: Mild Skin Induration: Absent Hyperpigmentation: Absent Redness: Absent Venous Ectasia: Absent Pain on calf compression: Absent Villalta Preliminary Score: 2 Is venous ulcer present?: No If venous ulcer is present and score is <15, then 15 points total are assigned: Absent Villalta Total Score: 2  LABS:  CBC     Component Value Date/Time   WBC 10.3 08/16/2022 1642   RBC 4.82 08/16/2022 1642   HGB 12.2 (L) 08/16/2022 1642  HCT 39.6 08/16/2022 1642   PLT 289 08/16/2022 1642   MCV 82.2 08/16/2022 1642   MCH 25.3 (L) 08/16/2022 1642   MCHC 30.8 08/16/2022 1642   RDW 15.1 08/16/2022 1642   LYMPHSABS 2.3 05/09/2020 0550   MONOABS 1.4 (H) 05/09/2020 0550   EOSABS 0.2 05/09/2020 0550   BASOSABS 0.1 05/09/2020 0550    Hepatic Function      Component Value Date/Time   PROT 7.2 05/09/2020 0550   ALBUMIN 2.3 (L) 05/09/2020 0550   AST 16 05/09/2020 0550   ALT 20 05/09/2020 0550   ALKPHOS 112 05/09/2020 0550   BILITOT 0.3 05/09/2020 0550   BILIDIR 0.1 12/02/2006 0835   IBILI 0.6 12/02/2006 0835    Renal Function   Lab Results  Component Value Date   CREATININE 0.97 08/16/2022   CREATININE 0.87 05/09/2020   CREATININE 0.89 01/23/2018    Estimated Creatinine Clearance: 99.9 mL/min (by C-G formula based on SCr of 0.97 mg/dL).   [facility] Vascular Lab Studies:  08/16/22  VAS [place] LOWER EXTREMITY VENOUS LEFT (DVT) Summary:  RIGHT:  - No evidence of common femoral vein obstruction.    LEFT:  - Findings consistent with acute deep vein thrombosis involving the left  common femoral vein, left femoral vein, left proximal profunda vein, left  popliteal vein, and left peroneal veins.  -  No cystic structure found in the popliteal fossa.   ASSESSMENT: Location of DVT: Left femoral vein, Left popliteal vein, Left distal vein Cause of DVT: unprovoked. 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 He was started on Xarelto in the ED. He was previously in the EINSTEIN trial in 2008 and received Xarelto for 3 months without issue. Labs from 08/16/22 are appropriate for Xarelto use. He is taking it correctly with food and has missed no doses or experienced any adverse effects. No medication access or cost issues at this time.   His pain and swelling have already significantly improved. We discussed that he will need lifelong anticoagulation given this is now his second unprovoked DVT. Will refer him to hematology for further work up and recommendations given recurrent DVT and family history of DVT.   Given involvement of the common femoral vein, I discussed the patient with Dr. Chestine Spore. Given minimal symptoms today and significant improvement in pain and swelling in just the few days he's been on Xarelto, there is no need for vascular surgery intervention at this time. External iliac vein is patent. Provided patient with clinic contact information should his symptoms worsen within the next few weeks.   PLAN: -Continue rivaroxaban (Xarelto) 15 mg twice daily with food for 21 days followed by 20 mg daily with food. -Expected duration of therapy: Lifelong given recurrent unprovoked DVT. Therapy started on 08/16/22. -Patient educated on purpose, proper use and potential adverse effects of rivaroxaban (Xarelto). -Discussed importance of taking medication around the same time every day. -Advised patient of medications to avoid (NSAIDs, aspirin doses >100 mg daily). -Educated that Tylenol (acetaminophen) is the preferred analgesic to lower the risk of bleeding. -Advised patient to alert all providers of anticoagulation therapy prior to starting a new medication or having  a procedure. -Emphasized importance of monitoring for signs and symptoms of bleeding (abnormal bruising, prolonged bleeding, nose bleeds, bleeding from gums, discolored urine, black tarry stools). -Educated patient to present to the ED if emergent signs and symptoms of new thrombosis occur. -Counseled patient to wear compression stockings daily, removing at night. -  Provided refills of Xarelto to his preferred pharmacy.  -Provided patient with contact information to establish with a primary care provider at [facility].   Follow up: with hematology. DVT Clinic as needed.   Pervis Hocking, PharmD, [place], CPP Deep Vein Thrombosis Clinic Clinical Pharmacist Practitioner Office: [phone]

## 2022-08-22 NOTE — Patient Instructions (Signed)
-  Continue rivaroxaban (Xarelto) 15 mg twice daily with food for 21 days followed by 20 mg daily with food. -Your [facility]. You may need to call the pharmacy to ask them to fill this when you start to run low on your current supply.  -Call to establish with a PCP. Primary Care at [facility]: [phone] -It is important to take your medication around the same time every day.  -Avoid NSAIDs like ibuprofen (Advil, Motrin) and naproxen (Aleve) as well as aspirin doses over 100 mg daily. -Tylenol (acetaminophen) is the preferred over the counter pain medication to lower the risk of bleeding. -Be sure to alert all of your health care providers that you are taking an anticoagulant prior to starting a new medication or having a procedure. -Monitor for signs and symptoms of bleeding (abnormal bruising, prolonged bleeding, nose bleeds, bleeding from gums, discolored urine, black tarry stools). If you have fallen and hit your head OR if your bleeding is severe or not stopping, seek emergency care.  -Go to the emergency room if emergent signs and symptoms of new clot occur (new or worse swelling and pain in an arm or leg, shortness of breath, chest pain, fast or irregular heartbeats, lightheadedness, dizziness, fainting, coughing up blood) or if you experience a significant color change (pale or blue) in the extremity that has the DVT.  -We recommend you wear compression stockings (20-30 mmHg) as long as you are having swelling or pain. Be sure to purchase the correct size and take them off at night.   Follow up here as needed.  [facility] [address] [place], [place], [place] [address] Enter the hospital through Entrance [address] off [address] and pull up to the Heart & Vascular Center entrance to the free valet parking.  Check in for your appointment at the Heart & Vascular Center.   If you have any questions or need to reschedule an appointment, please call  [phone] [facility].  If you are having an emergency, call 911 or present to the nearest emergency room.   What is a DVT?  -Deep vein thrombosis (DVT) is a condition in which a blood clot forms in a vein of the deep venous system which can occur in the lower leg, thigh, pelvis, arm, or neck. This condition is serious and can be life-threatening if the clot travels to the arteries of the lungs and causing a blockage (pulmonary embolism, PE). A DVT can also damage veins in the leg, which can lead to long-term venous disease, leg pain, swelling, discoloration, and ulcers or sores (post-thrombotic syndrome).  -Treatment may include taking an anticoagulant medication to prevent more clots from forming and the current clot from growing, wearing compression stockings, and/or surgical procedures to remove or dissolve the clot.

## 2022-08-26 ENCOUNTER — Telehealth (HOSPITAL_COMMUNITY): Payer: Self-pay | Admitting: Student-PharmD

## 2022-08-26 NOTE — Telephone Encounter (Signed)
At patient's last DVT Clinic visit he asked for a work note requesting lighter restrictions at work. He works at [employer] and is typically given the heaviest truck. He was hoping he could have a lighter truck for a few weeks while his leg recovers as he was in a lot of pain at that time and walking with crutches. He calls today because with any level of restrictions, [employer] would not let him work. He stayed at home last week and elevated his leg. He's been taking Eliquis without missed doses. He reports that his leg is feeling much better and he is walking normally, so he would like a note stating he is okay to return to work. From our standpoint, he is safe to return to work. Patient will sign up for MyChart so we can send him this note through the portal.

## 2022-09-03 ENCOUNTER — Other Ambulatory Visit: Payer: Self-pay

## 2022-09-03 ENCOUNTER — Emergency Department (HOSPITAL_COMMUNITY)
Admission: EM | Admit: 2022-09-03 | Discharge: 2022-09-03 | Disposition: A | Discharge status: Home / Self Care | Payer: BC Managed Care – PPO | Source: Home / Self Care | Attending: Emergency Medicine | Admitting: Emergency Medicine

## 2022-09-03 ENCOUNTER — Encounter (HOSPITAL_COMMUNITY): Payer: Self-pay

## 2022-09-03 DIAGNOSIS — L732 Hidradenitis suppurativa: Secondary | ICD-10-CM

## 2022-09-03 DIAGNOSIS — Z7901 Long term (current) use of anticoagulants: Secondary | ICD-10-CM | POA: Diagnosis not present

## 2022-09-03 DIAGNOSIS — L0231 Cutaneous abscess of buttock: Secondary | ICD-10-CM | POA: Diagnosis not present

## 2022-09-03 DIAGNOSIS — L0291 Cutaneous abscess, unspecified: Secondary | ICD-10-CM

## 2022-09-03 MED ORDER — LIDOCAINE-EPINEPHRINE (PF) 2 %-1:200000 IJ SOLN
10.0000 mL | Freq: Once | INTRAMUSCULAR | Status: DC
  Filled 2022-09-03: qty 20

## 2022-09-03 MED ORDER — HYDROCODONE-ACETAMINOPHEN 5-325 MG PO TABS: 1.0000 | ORAL_TABLET | Freq: Four times a day (QID) | ORAL | 0 refills | Status: DC | PRN

## 2022-09-03 MED ORDER — DOXYCYCLINE HYCLATE 100 MG PO CAPS: 100.0000 mg | ORAL_CAPSULE | Freq: Two times a day (BID) | ORAL | 0 refills | Status: DC

## 2022-09-03 NOTE — ED Provider Notes (Signed)
[place] EMERGENCY DEPARTMENT AT [facility] Provider Note   CSN: [id number] Arrival date & time: 09/03/22  1324     History  Chief Complaint  Patient presents with   Abscess    Adam Christian is a 42 y.o. male.  HPI Patient has history of hidradenitis suppurativa.  He reports he had prior surgical treatment in the gluteal cleft.  He has developed an area of significant pain and swelling in his buttock.  He has multiple chronic areas of induration and scarring but he reports that this area has become excruciatingly painful over the past 2 days and at this point walking or sitting on it is unbearable.  No fever chills or general malaise.  Patient was recently started on Xarelto for DVT of the left lower extremity.  Patient reports that swelling has improved significantly since starting Xarelto.  He reports pain and swelling has resolved in the leg and he is not having any trouble with the Xarelto.    Home Medications Prior to Admission medications   Medication Sig Start Date End Date Taking? Authorizing Provider  doxycycline (VIBRAMYCIN) 100 MG capsule Take 1 capsule (100 mg total) by mouth 2 (two) times daily. One po bid x 7 days 09/03/22  Yes Dayven Linsley, Lebron Conners, MD  HYDROcodone-acetaminophen (NORCO/VICODIN) 5-325 MG tablet Take 1 tablet by mouth every 6 (six) hours as needed for moderate pain. 09/03/22  Yes Arby Barrette, MD  rivaroxaban (XARELTO) 20 MG TABS tablet Take 1 tablet (20 mg total) by mouth daily with supper. Take with food. Start taking after completion of starter pack. 08/22/22   Dicie Beam, RPH-CPP  RIVAROXABAN Carlena Hurl) VTE STARTER PACK (15 & 20 MG) Take 15 mg by mouth in the morning and at bedtime for 21 days, THEN 20 mg daily. Follow package directions: Take one 15mg  tablet by mouth twice a day. On day 22, switch to one 20mg  tablet once a day. Take with food.. 08/16/22 10/06/22  Manuela Neptune, MD      Allergies    Iodine and Shrimp [shellfish  allergy]    Review of Systems   Review of Systems  Physical Exam Updated Vital Signs BP 132/85   Pulse 77   Temp 98.4 F (36.9 C) (Oral)   Resp 18   Ht 5\' 6"  (1.676 m)   Wt 77.1 kg   SpO2 99%   BMI 27.43 kg/m  Physical Exam Constitutional:      Comments: Nontoxic generally well in appearance.  Pulmonary:     Effort: Pulmonary effort is normal.  Genitourinary:    Comments: Gluteal cleft has multiple areas of scars, induration, some fistulae essentially from top to the bottom.  Anus is essentially spared.  Patient has a focus of tenderness on the left gluteal cleft medial to a larger indurated scar.  This area feels fluctuant.  He identifies this is a very painful area.  And all the other areas that have some drainage and fistulization and scarring he denies tenderness feels like it is at baseline.  I did take ultrasound and confirmed that the area of the patient has pain does contain a large pocket of fluid. Musculoskeletal:     Comments: This time lower extremities are symmetric in appearance.  He does not have significant peripheral edema in the left lower extremity which has known DVT.  Neurological:     General: No focal deficit present.     Mental Status: He is oriented to person, place, and time.  Coordination: Coordination normal.  Psychiatric:        Mood and Affect: Mood normal.     ED Results / Procedures / Treatments   Labs (all labs ordered are listed, but only abnormal results are displayed) Labs Reviewed - No data to display  EKG None  Radiology No results found.  Procedures .Marland KitchenIncision and Drainage  Date/Time: 09/03/2022 12:11 PM  Performed by: Arby Barrette, MD Authorized by: Arby Barrette, MD   Consent:    Consent obtained:  Verbal   Consent given by:  Patient   Risks discussed:  Bleeding, incomplete drainage, pain, infection and damage to other organs Location:    Type:  Abscess   Location:  Anogenital   Anogenital location:  Gluteal  cleft Pre-procedure details:    Skin preparation:  Chlorhexidine Anesthesia:    Anesthesia method:  Local infiltration   Local anesthetic:  Lidocaine 2% WITH epi Procedure type:    Complexity:  Complex Procedure details:    Ultrasound guidance: yes     Incision types:  Single straight   Incision depth:  Subcutaneous   Wound management:  Probed and deloculated   Drainage:  Purulent   Drainage amount:  Copious   Packing materials:  1/4 in iodoform gauze Post-procedure details:    Procedure completion:  Tolerated well, no immediate complications     Medications Ordered in ED Medications  lidocaine-EPINEPHrine (XYLOCAINE W/EPI) 2 %-1:200000 (PF) injection 10 mL (has no administration in time range)    ED Course/ Medical Decision Making/ A&P                             Medical Decision Making Risk Prescription drug management.   Patient has area of acute abscess with significant chronic hidradenitis in the gluteal cleft.  Focus was identified with the patient's identification of his area of significant pain and confirmed with ultrasound that there was a fairly large abscess pocket.  Incision and drainage was done which yielded a large amount of purulent drainage.  It is currently taking Xarelto.  There was not excessive bleeding.  This was packed with quarter inch iodoform and then dressed.  Patient had much relief of pain.  At this time we will place him on doxycycline for 10 days.  Vicodin given for pain if needed.  We reviewed a follow-up plan with ideally following up with the surgeon who previously treated his hidradenitis and return precautions.        Final Clinical Impression(s) / ED Diagnoses Final diagnoses:  Abscess  Hydradenitis    Rx / DC Orders ED Discharge Orders          Ordered    doxycycline (VIBRAMYCIN) 100 MG capsule  2 times daily        09/03/22 1209    HYDROcodone-acetaminophen (NORCO/VICODIN) 5-325 MG tablet  Every 6 hours PRN        09/03/22  1209              Arby Barrette, MD 09/03/22 1217

## 2022-09-03 NOTE — ED Triage Notes (Signed)
Pt c/o abscess on left buttocksx2d. Pt states he's on xarelto. The area where the abscess is, is hard. The area has a lot of scar tissue from previous surgeries.

## 2022-09-03 NOTE — Discharge Instructions (Signed)
Start taking doxycycline as prescribed.  You may take 1 Vicodin tablet every 6 hours as needed for pain.  Your packing should be removed in 48 hours.  If it falls out, continue to keep the area clean and dressed.  Schedule follow-up with the surgeon you have seen previously for your abscesses.  Return to the emergency department if you are getting worsening swelling, fever, pain or other concerning changes.

## 2022-09-03 NOTE — ED Notes (Signed)
DC instructions reviewed with pt. PT verbalized understanding. Pt DC °

## 2022-09-19 ENCOUNTER — Other Ambulatory Visit: Payer: Self-pay

## 2022-09-19 ENCOUNTER — Inpatient Hospital Stay: Payer: BC Managed Care – PPO | Attending: Hematology and Oncology | Admitting: Hematology and Oncology

## 2022-09-19 ENCOUNTER — Inpatient Hospital Stay: Payer: BC Managed Care – PPO

## 2022-09-19 VITALS — BP 132/77 | HR 69 | Temp 97.5°F | Resp 18 | Ht 66.0 in | Wt 184.4 lb

## 2022-09-19 DIAGNOSIS — E669 Obesity, unspecified: Secondary | ICD-10-CM | POA: Insufficient documentation

## 2022-09-19 DIAGNOSIS — F17210 Nicotine dependence, cigarettes, uncomplicated: Secondary | ICD-10-CM | POA: Diagnosis not present

## 2022-09-19 DIAGNOSIS — I82412 Acute embolism and thrombosis of left femoral vein: Secondary | ICD-10-CM

## 2022-09-19 DIAGNOSIS — Z7901 Long term (current) use of anticoagulants: Secondary | ICD-10-CM | POA: Diagnosis not present

## 2022-09-19 DIAGNOSIS — I82432 Acute embolism and thrombosis of left popliteal vein: Secondary | ICD-10-CM | POA: Insufficient documentation

## 2022-09-19 DIAGNOSIS — I82452 Acute embolism and thrombosis of left peroneal vein: Secondary | ICD-10-CM | POA: Insufficient documentation

## 2022-09-19 DIAGNOSIS — D6861 Antiphospholipid syndrome: Secondary | ICD-10-CM | POA: Diagnosis not present

## 2022-09-19 LAB — ANTITHROMBIN III: AntiThromb III Func: 106 % (ref 75–120)

## 2022-09-19 NOTE — Progress Notes (Addendum)
[facility] CONSULT NOTE  Patient Care Team: Leilani Able, MD as PCP - General (Family Medicine)  CHIEF COMPLAINTS/PURPOSE OF CONSULTATION:  Acute DVT  HISTORY OF PRESENTING ILLNESS:  Adam Christian 42 y.o. male is here because of recent history of acute DVT involving his left leg.  He tells me that he had a previous history of blood clots when he was 42 years old and he developed profound blood clots at that time and may have participated in the initial clinical trials for Xarelto.  He has been placed back on Xarelto and was referred to [place] because of recurrent blood clots.  His sister also has blood clots as well as other family members.  I reviewed her records extensively and collaborated the history with the patient.   MEDICAL HISTORY:  Past Medical History:  Diagnosis Date   Cyst 42 years old was first occurance   Pt has multiple cysts: L axila, groing, gluteous, scrotum   Depression    Hydradenitis    Peripheral vascular disease (HCC)    right leg blood clot   Seizures (HCC)    as a child    SURGICAL HISTORY: Past Surgical History:  Procedure Laterality Date   boil     HYDRADENITIS EXCISION N/A 05/09/2020   Procedure: DEBRIDEMENT OF RIGHT GROIN AND LEFT BUTTOCK HIDRADENITIS;  Surgeon: Harriette Bouillon, MD;  Location: [facility] OR;  Service: General;  Laterality: N/A;   KIDNEY DONATION     KIDNEY SURGERY  March 29, 2003   patient gave mother one of his kidneys   TONSILLECTOMY     TONSILLECTOMY      SOCIAL HISTORY: Social History   Socioeconomic History   Marital status: Single    Spouse name: Not on file   Number of children: Not on file   Years of education: Not on file   Highest education level: Not on file  Occupational History   Not on file  Tobacco Use   Smoking status: Some Days    Current packs/day: 0.00    Types: Cigarettes    Last attempt to quit: 04/27/2020    Years since quitting: 2.3   Smokeless tobacco: Never  Vaping Use   Vaping  status: Never Used  Substance and Sexual Activity   Alcohol use: Yes    Comment: occasionally   Drug use: Yes    Frequency: 14.0 times per week    Types: Marijuana    Comment: occasionally   Sexual activity: Yes    Birth control/protection: Condom  Other Topics Concern   Not on file  Social History Narrative   Not on file   Social Determinants of Health   Financial Resource Strain: Not on file  Food Insecurity: Not on file  Transportation Needs: Not on file  Physical Activity: Not on file  Stress: Not on file  Social Connections: Not on file  Intimate Partner Violence: Not on file    FAMILY HISTORY: Family History  Problem Relation Age of Onset   Kidney failure Mother    Hypertension Mother    Hypertension Sister     ALLERGIES:  is allergic to iodine and shrimp [shellfish allergy].  MEDICATIONS:  Current Outpatient Medications  Medication Sig Dispense Refill   doxycycline (VIBRAMYCIN) 100 MG capsule Take 1 capsule (100 mg total) by mouth 2 (two) times daily. One po bid x 7 days 20 capsule 0   HYDROcodone-acetaminophen (NORCO/VICODIN) 5-325 MG tablet Take 1 tablet by mouth every 6 (six) hours as needed  for moderate pain. 15 tablet 0   rivaroxaban (XARELTO) 20 MG TABS tablet Take 1 tablet (20 mg total) by mouth daily with supper. Take with food. Start taking after completion of starter pack. 90 tablet 1   RIVAROXABAN (XARELTO) VTE STARTER PACK (15 & 20 MG) Take 15 mg by mouth in the morning and at bedtime for 21 days, THEN 20 mg daily. Follow package directions: Take one 15mg  tablet by mouth twice a day. On day 22, switch to one 20mg  tablet once a day. Take with food.. 51 each 0   No current facility-administered medications for this visit.    REVIEW OF SYSTEMS:   Constitutional: Denies fevers, chills or abnormal night sweats All other systems were reviewed with the patient and are negative.  PHYSICAL EXAMINATION: ECOG PERFORMANCE STATUS: 1 - Symptomatic but  completely ambulatory  Vitals:   09/19/22 1210  BP: 132/77  Pulse: 69  Resp: 18  Temp: (!) 97.5 F (36.4 C)  SpO2: 99%   Filed Weights   09/19/22 1210  Weight: 184 lb 6.4 oz (83.6 kg)    GENERAL:alert, no distress and comfortable  LABORATORY DATA:  I have reviewed the data as listed Lab Results  Component Value Date   WBC 10.3 08/16/2022   HGB 12.2 (L) 08/16/2022   HCT 39.6 08/16/2022   MCV 82.2 08/16/2022   PLT 289 08/16/2022   Lab Results  Component Value Date   NA 137 08/16/2022   K 4.0 08/16/2022   CL 105 08/16/2022   CO2 24 08/16/2022    RADIOGRAPHIC STUDIES: I have personally reviewed the radiological reports and agreed with the findings in the report.  ASSESSMENT AND PLAN:  Acute deep vein thrombosis (DVT) of femoral vein of left lower extremity (HCC) 08/16/2022: Acute DVT left common femoral vein, left femoral vein, left proximal profunda vein, left popliteal vein, left peroneal veins   I discussed with the patient risk factors for blood clots.  Inherited risk factors include: 1. Factor V Leiden mutation 2. Prothrombin gene G20210A 3. Protein S deficiency  4. Protein C deficiency  5. Antithrombin deficiency  Acquired risk factors include: 1. Antiphospholipid antibody syndrome 2. Tobacco use 3. Obesity 4. Medications including testosterone replacement 5. Sedentary behavior including postoperative state 6. Foreign bodies in circulation  The only risk factor that the patient has his tobacco use.  Workup recommended: Bloodwork to evaluate for the 5 inherited factors mentioned above along with antiphospholipid antibodies. Telephone visit in one week to discuss the results of the tests   All questions were answered. The patient knows to call the clinic with any problems, questions or concerns.    Tamsen Meek, MD 09/19/22

## 2022-09-19 NOTE — Assessment & Plan Note (Signed)
08/16/2022: Acute DVT left common femoral vein, left femoral vein, left proximal profunda vein, left popliteal vein, left peroneal veins   I discussed with the patient risk factors for blood clots.  Inherited risk factors include: 1. Factor V Leiden mutation 2. Prothrombin gene G20210A 3. Protein S deficiency  4. Protein C deficiency  5. Antithrombin deficiency  Acquired risk factors include: 1. Antiphospholipid antibody syndrome 2. Tobacco use 3. Obesity 4. Medications including oral contraceptives 5. Sedentary behavior including postoperative state 6. Foreign bodies in circulation  Workup recommended: Bloodwork to evaluate for the 5 inherited factors mentioned above along with antiphospholipid antibodies. Return to clinic in one to 2 weeks to discuss the results of the tests

## 2022-09-20 ENCOUNTER — Telehealth: Payer: Self-pay | Admitting: Hematology and Oncology

## 2022-09-20 NOTE — Telephone Encounter (Signed)
Contacted patient to scheduled appointments. Patient is aware of appointments that are scheduled.   

## 2022-09-24 NOTE — Progress Notes (Signed)
HEMATOLOGY-ONCOLOGY TELEPHONE VISIT PROGRESS NOTE  I connected with our patient on 09/26/22 at  8:45 AM EDT by telephone and verified that I am speaking with the correct person using two identifiers.  I discussed the limitations, risks, security and privacy concerns of performing an evaluation and management service by telephone and the availability of in person appointments.  I also discussed with the patient that there may be a patient responsible charge related to this service. The patient expressed understanding and agreed to proceed.   History of Present Illness: Adam Christian 42 y.o. male is here because of recent history of acute DVT involving his left leg.  He presents to the clinic for a telephone follow-up to review labs.  He reports that when he missed a dose of anticoagulation Xarelto his leg swelled up but it has since improved.    REVIEW OF SYSTEMS:   Constitutional: Denies fevers, chills or abnormal weight loss All other systems were reviewed with the patient and are negative. Observations/Objective:     Assessment Plan:  Acute deep vein thrombosis (DVT) of femoral vein of left lower extremity (HCC) 08/16/2022: Acute DVT left common femoral vein, left femoral vein, left proximal profunda vein, left popliteal vein, left peroneal veins  Hypercoagulability panel: 09/19/2022: Protein C 67%, protein S 107%, Antithrombin III 106%, beta-2 glycoprotein 1 antibodies negative, cardiolipin antibodies negative, factor V Leiden: Not detected lupus anticoagulant: Positive, factor VIII 149%, prothrombin gene mutation pending  Current treatment: Xarelto  Treatment plan: We recommend rechecking the lupus anticoagulant again in 3 months If it is persistently positive then patient will need anticoagulation indefinitely.  Return to clinic after labs in 3 months for telephone visit    I discussed the assessment and treatment plan with the patient. The patient was provided an opportunity to ask  questions and all were answered. The patient agreed with the plan and demonstrated an understanding of the instructions. The patient was advised to call back or seek an in-person evaluation if the symptoms worsen or if the condition fails to improve as anticipated.   I provided 12 minutes of non-face-to-face time during this encounter.  This includes time for charting and coordination of care   Tamsen Meek, MD  I Janan Ridge am acting as a scribe for Dr.Vinay Gudena  I have reviewed the above documentation for accuracy and completeness, and I agree with the above.

## 2022-09-26 ENCOUNTER — Inpatient Hospital Stay (HOSPITAL_BASED_OUTPATIENT_CLINIC_OR_DEPARTMENT_OTHER): Payer: BC Managed Care – PPO | Admitting: Hematology and Oncology

## 2022-09-26 DIAGNOSIS — I82412 Acute embolism and thrombosis of left femoral vein: Secondary | ICD-10-CM | POA: Diagnosis not present

## 2022-09-26 NOTE — Assessment & Plan Note (Signed)
08/16/2022: Acute DVT left common femoral vein, left femoral vein, left proximal profunda vein, left popliteal vein, left peroneal veins  Hypercoagulability panel: 09/19/2022: Protein C 67%, protein S 107%, Antithrombin III 106%, beta-2 glycoprotein 1 antibodies negative, cardiolipin antibodies negative, factor V Leiden: Not detected lupus anticoagulant: Positive, factor VIII 149%, prothrombin gene mutation pending  Treatment plan: We recommend rechecking the lupus anticoagulant again in 3 months If it is persistently positive then patient will need anticoagulation indefinitely.  Return to clinic after labs in 3 months for telephone visit

## 2022-09-27 ENCOUNTER — Telehealth: Payer: Self-pay | Admitting: Hematology and Oncology

## 2022-09-27 NOTE — Telephone Encounter (Signed)
Scheduled appointments per 8/15 los. Left voicemail for patient with appointment details.

## 2022-11-21 ENCOUNTER — Emergency Department (HOSPITAL_COMMUNITY): Payer: BC Managed Care – PPO

## 2022-11-21 ENCOUNTER — Encounter (HOSPITAL_COMMUNITY): Payer: Self-pay

## 2022-11-21 ENCOUNTER — Other Ambulatory Visit: Payer: Self-pay

## 2022-11-21 ENCOUNTER — Emergency Department (HOSPITAL_COMMUNITY)
Admission: EM | Admit: 2022-11-21 | Discharge: 2022-11-22 | Disposition: A | Discharge status: Home / Self Care | Payer: BC Managed Care – PPO | Attending: Emergency Medicine | Admitting: Emergency Medicine

## 2022-11-21 DIAGNOSIS — R0602 Shortness of breath: Secondary | ICD-10-CM

## 2022-11-21 DIAGNOSIS — J40 Bronchitis, not specified as acute or chronic: Secondary | ICD-10-CM | POA: Diagnosis not present

## 2022-11-21 LAB — I-STAT CHEM 8, ED
BUN: 10 mg/dL (ref 6–20)
Calcium, Ion: 0.93 mmol/L — ABNORMAL LOW (ref 1.15–1.40)
Chloride: 107 mmol/L (ref 98–111)
Creatinine, Ser: 1 mg/dL (ref 0.61–1.24)
Glucose, Bld: 106 mg/dL — ABNORMAL HIGH (ref 70–99)
HCT: 41 % (ref 39.0–52.0)
Hemoglobin: 13.9 g/dL (ref 13.0–17.0)
Potassium: 3.7 mmol/L (ref 3.5–5.1)
Sodium: 136 mmol/L (ref 135–145)
TCO2: 21 mmol/L — ABNORMAL LOW (ref 22–32)

## 2022-11-21 LAB — CBC WITH DIFFERENTIAL/PLATELET
Abs Immature Granulocytes: 0.04 10*3/uL (ref 0.00–0.07)
Basophils Absolute: 0.1 10*3/uL (ref 0.0–0.1)
Basophils Relative: 1 %
Eosinophils Absolute: 0.1 10*3/uL (ref 0.0–0.5)
Eosinophils Relative: 1 %
HCT: 39 % (ref 39.0–52.0)
Hemoglobin: 12.2 g/dL — ABNORMAL LOW (ref 13.0–17.0)
Immature Granulocytes: 0 %
Lymphocytes Relative: 28 %
Lymphs Abs: 3.3 10*3/uL (ref 0.7–4.0)
MCH: 24.5 pg — ABNORMAL LOW (ref 26.0–34.0)
MCHC: 31.3 g/dL (ref 30.0–36.0)
MCV: 78.5 fL — ABNORMAL LOW (ref 80.0–100.0)
Monocytes Absolute: 1.1 10*3/uL — ABNORMAL HIGH (ref 0.1–1.0)
Monocytes Relative: 9 %
Neutro Abs: 7.3 10*3/uL (ref 1.7–7.7)
Neutrophils Relative %: 61 %
Platelets: 421 10*3/uL — ABNORMAL HIGH (ref 150–400)
RBC: 4.97 MIL/uL (ref 4.22–5.81)
RDW: 13.6 % (ref 11.5–15.5)
WBC: 11.9 10*3/uL — ABNORMAL HIGH (ref 4.0–10.5)
nRBC: 0 % (ref 0.0–0.2)

## 2022-11-21 LAB — BASIC METABOLIC PANEL
Anion gap: 16 — ABNORMAL HIGH (ref 5–15)
BUN: 10 mg/dL (ref 6–20)
CO2: 19 mmol/L — ABNORMAL LOW (ref 22–32)
Calcium: 9 mg/dL (ref 8.9–10.3)
Chloride: 101 mmol/L (ref 98–111)
Creatinine, Ser: 1.03 mg/dL (ref 0.61–1.24)
GFR, Estimated: >60 mL/min (ref >60)
Glucose, Bld: 103 mg/dL — ABNORMAL HIGH (ref 70–99)
Potassium: 3.3 mmol/L — ABNORMAL LOW (ref 3.5–5.1)
Sodium: 136 mmol/L (ref 135–145)

## 2022-11-21 LAB — TROPONIN I (HIGH SENSITIVITY): Troponin I (High Sensitivity): 5 ng/L (ref <18)

## 2022-11-21 LAB — I-STAT CG4 LACTIC ACID, ED: Lactic Acid, Venous: 2 mmol/L (ref 0.5–1.9)

## 2022-11-21 MED ORDER — ALBUTEROL SULFATE HFA 108 (90 BASE) MCG/ACT IN AERS
2.0000 | INHALATION_SPRAY | Freq: Once | RESPIRATORY_TRACT | Status: AC
  Administered 2022-11-21: 2 via RESPIRATORY_TRACT
  Filled 2022-11-21: qty 6.7

## 2022-11-21 MED ORDER — IOHEXOL 350 MG/ML SOLN
75.0000 mL | Freq: Once | INTRAVENOUS | Status: AC | PRN
  Administered 2022-11-21: 75 mL via INTRAVENOUS

## 2022-11-21 MED ORDER — ALBUTEROL SULFATE (2.5 MG/3ML) 0.083% IN NEBU: 2.5000 mg | INHALATION_SOLUTION | Freq: Once | RESPIRATORY_TRACT | Status: DC

## 2022-11-21 NOTE — ED Notes (Signed)
Patient transported to X-ray 

## 2022-11-21 NOTE — ED Triage Notes (Signed)
Patient presents with shortness of breath and trembling. Patient reports SOB x 2 weeks, O2 100% on RA, lung sounds clear, patient reports 3/10 chest tightness on inspiration.

## 2022-11-22 LAB — TROPONIN I (HIGH SENSITIVITY): Troponin I (High Sensitivity): 8 ng/L (ref <18)

## 2022-11-22 MED ORDER — DEXAMETHASONE SODIUM PHOSPHATE 10 MG/ML IJ SOLN
10.0000 mg | Freq: Once | INTRAMUSCULAR | Status: AC
  Administered 2022-11-22: 10 mg via INTRAVENOUS
  Filled 2022-11-22: qty 1

## 2022-11-22 NOTE — ED Notes (Signed)
Pt provided with sandwich bag per request with provider approval.

## 2022-11-22 NOTE — Discharge Instructions (Signed)
You were seen today for shortness of breath and cough.  Use the inhaler as needed for cough and shortness of breath.  You should stop smoking.

## 2022-11-22 NOTE — ED Provider Notes (Signed)
[place] EMERGENCY DEPARTMENT AT [facility] Provider Note   CSN: [id number] Arrival date & time: 11/21/22  2048     History  Chief Complaint  Patient presents with   Shortness of Breath   Shaking    Adam Christian is a 42 y.o. male.  HPI     This is a 42 year old male who presents with concerns for shortness of breath.  Reports he has had increasing shortness of breath over the last week.  States that he has also had some cough.  Cough is nonproductive.  He is a smoker.  He reports tightness across his chest.  Reports that tightness is worse with deep inspiration.  Was given an inhaler in triage and states that he had some relief.  No fevers.  Home Medications Prior to Admission medications   Medication Sig Start Date End Date Taking? Authorizing Provider  rivaroxaban (XARELTO) 20 MG TABS tablet Take 1 tablet (20 mg total) by mouth daily with supper. Take with food. Start taking after completion of starter pack. Patient not taking: Reported on 11/22/2022 08/22/22   Pervis Hocking B, RPH-CPP      Allergies    Shrimp [shellfish allergy]    Review of Systems   Review of Systems  Constitutional:  Negative for fever.  Respiratory:  Positive for cough, chest tightness and shortness of breath.   All other systems reviewed and are negative.   Physical Exam Updated Vital Signs BP (!) 140/89 (BP Location: Right Arm)   Pulse 71   Temp 98.5 F (36.9 C) (Oral)   Resp 20   Ht 1.676 m (5\' 6" )   Wt 77.1 kg   SpO2 100%   BMI 27.44 kg/m  Physical Exam Vitals and nursing note reviewed.  Constitutional:      Appearance: He is well-developed. He is not ill-appearing.  HENT:     Head: Normocephalic and atraumatic.  Eyes:     Pupils: Pupils are equal, round, and reactive to light.  Cardiovascular:     Rate and Rhythm: Normal rate and regular rhythm.     Heart sounds: Normal heart sounds. No murmur heard. Pulmonary:     Effort: Pulmonary effort is normal. No  respiratory distress.     Breath sounds: Wheezing present.     Comments: Occasional expiratory wheeze Abdominal:     General: Bowel sounds are normal.     Palpations: Abdomen is soft.     Tenderness: There is no abdominal tenderness. There is no rebound.  Musculoskeletal:     Cervical back: Neck supple.  Lymphadenopathy:     Cervical: No cervical adenopathy.  Skin:    General: Skin is warm and dry.  Neurological:     Mental Status: He is alert and oriented to person, place, and time.  Psychiatric:        Mood and Affect: Mood normal.     ED Results / Procedures / Treatments   Labs (all labs ordered are listed, but only abnormal results are displayed) Labs Reviewed  BASIC METABOLIC PANEL - Abnormal; Notable for the following components:      Result Value   Potassium 3.3 (*)    CO2 19 (*)    Glucose, Bld 103 (*)    Anion gap 16 (*)    All other components within normal limits  CBC WITH DIFFERENTIAL/PLATELET - Abnormal; Notable for the following components:   WBC 11.9 (*)    Hemoglobin 12.2 (*)    MCV 78.5 (*)  MCH 24.5 (*)    Platelets 421 (*)    Monocytes Absolute 1.1 (*)    All other components within normal limits  I-STAT CHEM 8, ED - Abnormal; Notable for the following components:   Glucose, Bld 106 (*)    Calcium, Ion 0.93 (*)    TCO2 21 (*)    All other components within normal limits  I-STAT CG4 LACTIC ACID, ED - Abnormal; Notable for the following components:   Lactic Acid, Venous 2.0 (*)    All other components within normal limits  TROPONIN I (HIGH SENSITIVITY)  TROPONIN I (HIGH SENSITIVITY)    EKG EKG Interpretation Date/Time:  Thursday November 21 2022 20:56:01 EDT Ventricular Rate:  94 PR Interval:  152 QRS Duration:  90 QT Interval:  362 QTC Calculation: 452 R Axis:   71  Text Interpretation: Normal sinus rhythm Normal ECG No previous ECGs available Confirmed by Ross Marcus ([id number]) on 11/22/2022 2:49:10 AM  Radiology CT Angio Chest PE  W and/or Wo Contrast  Result Date: 11/21/2022 CLINICAL DATA:  Sudden onset pleuritic chest pain. PE suspected. Shortness of breath. Known DVT. EXAM: CT ANGIOGRAPHY CHEST WITH CONTRAST TECHNIQUE: Multidetector CT imaging of the chest was performed using the standard protocol during bolus administration of intravenous contrast. Multiplanar CT image reconstructions and MIPs were obtained to evaluate the vascular anatomy. RADIATION DOSE REDUCTION: This exam was performed according to the departmental dose-optimization program which includes automated exposure control, adjustment of the mA and/or kV according to patient size and/or use of iterative reconstruction technique. CONTRAST:  75mL OMNIPAQUE IOHEXOL 350 MG/ML SOLN COMPARISON:  Chest radiograph 11/21/2022 FINDINGS: Cardiovascular: Negative for acute pulmonary embolism. Normal caliber thoracic aorta. No pericardial effusion. Mediastinum/Nodes: Trachea and esophagus are unremarkable. No thoracic adenopathy Lungs/Pleura: No focal consolidation, pleural effusion, or pneumothorax. Upper Abdomen: No acute abnormality. Musculoskeletal: No acute fracture. Review of the MIP images confirms the above findings. IMPRESSION: Negative for acute pulmonary embolism. No acute abnormality in the chest. Electronically Signed   By: Minerva Fester M.D.   On: 11/21/2022 23:06   DG Chest 2 View  Result Date: 11/21/2022 CLINICAL DATA:  Shortness of breath. EXAM: CHEST - 2 VIEW COMPARISON:  02/02/2015. FINDINGS: The heart size and mediastinal contours are within normal limits. No consolidation, effusion, or pneumothorax. No acute osseous abnormality. IMPRESSION: No active cardiopulmonary disease. Electronically Signed   By: Thornell Sartorius M.D.   On: 11/21/2022 22:31    Procedures Procedures    Medications Ordered in ED Medications  albuterol (VENTOLIN HFA) 108 (90 Base) MCG/ACT inhaler 2 puff (2 puffs Inhalation Given 11/21/22 2200)  iohexol (OMNIPAQUE) 350 MG/ML  injection 75 mL (75 mLs Intravenous Contrast Given 11/21/22 2219)  dexamethasone (DECADRON) injection 10 mg (10 mg Intravenous Given 11/22/22 0328)    ED Course/ Medical Decision Making/ A&P                                 Medical Decision Making Risk Prescription drug management.   This patient presents to the ED for concern of shortness of breath, this involves an extensive number of treatment options, and is a complaint that carries with it a high risk of complications and morbidity.  I considered the following differential and admission for this acute, potentially life threatening condition.  The differential diagnosis includes bronchitis, pneumonia, pneumothorax, ACS, PE  MDM:    This is a 42 year old male who presents with shortness of breath.  He is overall nontoxic.  Had improvement with albuterol.  Vital signs are reassuring.  He is afebrile.  Chest x-ray without pneumonia.  Labs obtained and reviewed and largely reassuring.  CT scan does not show any evidence of PE.  Patient was given a dose of Decadron.  Given his smoking history and occasional wheeze, highly suspect bronchitis with cough.  Denies any other infectious symptoms.  EKG and troponin x 2 are reassuring.  (Labs, imaging, consults)  Labs: I Ordered, and personally interpreted labs.  The pertinent results include: CBC, BMP, troponin x 2  Imaging Studies ordered: I ordered imaging studies including chest x-ray, CT scan I independently visualized and interpreted imaging. I agree with the radiologist interpretation  Additional history obtained from chart review.  External records from outside source obtained and reviewed including prior evaluations  Cardiac Monitoring: The patient was maintained on a cardiac monitor.  If on the cardiac monitor, I personally viewed and interpreted the cardiac monitored which showed an underlying rhythm of: Sinus rhythm  Reevaluation: After the interventions noted above, I  reevaluated the patient and found that they have :improved  Social Determinants of Health:  lives independently  Disposition: Discharge  Co morbidities that complicate the patient evaluation  Past Medical History:  Diagnosis Date   Cyst 42 years old was first occurance   Pt has multiple cysts: L axila, groing, gluteous, scrotum   Depression    Hydradenitis    Peripheral vascular disease (HCC)    right leg blood clot   Seizures (HCC)    as a child     Medicines Meds ordered this encounter  Medications   DISCONTD: albuterol (PROVENTIL) (2.5 MG/3ML) 0.083% nebulizer solution 2.5 mg   albuterol (VENTOLIN HFA) 108 (90 Base) MCG/ACT inhaler 2 puff   iohexol (OMNIPAQUE) 350 MG/ML injection 75 mL   dexamethasone (DECADRON) injection 10 mg    I have reviewed the patients home medicines and have made adjustments as needed  Problem List / ED Course: Problem List Items Addressed This Visit   None Visit Diagnoses     Shortness of breath    -  Primary   Bronchitis                       Final Clinical Impression(s) / ED Diagnoses Final diagnoses:  Shortness of breath  Bronchitis    Rx / DC Orders ED Discharge Orders     None         Shon Baton, MD 11/22/22 [phone]

## 2022-12-06 ENCOUNTER — Other Ambulatory Visit: Payer: Self-pay

## 2022-12-06 ENCOUNTER — Emergency Department (HOSPITAL_COMMUNITY)
Admission: EM | Admit: 2022-12-06 | Discharge: 2022-12-06 | Disposition: A | Discharge status: Home / Self Care | Payer: BC Managed Care – PPO | Attending: Emergency Medicine | Admitting: Emergency Medicine

## 2022-12-06 ENCOUNTER — Encounter (HOSPITAL_COMMUNITY): Payer: Self-pay

## 2022-12-06 DIAGNOSIS — Z7901 Long term (current) use of anticoagulants: Secondary | ICD-10-CM | POA: Diagnosis not present

## 2022-12-06 DIAGNOSIS — R1031 Right lower quadrant pain: Secondary | ICD-10-CM | POA: Diagnosis present

## 2022-12-06 DIAGNOSIS — L732 Hidradenitis suppurativa: Secondary | ICD-10-CM | POA: Insufficient documentation

## 2022-12-06 HISTORY — DX: Acute embolism and thrombosis of unspecified deep veins of unspecified lower extremity: I82.409

## 2022-12-06 LAB — CBC
HCT: 37.5 % — ABNORMAL LOW (ref 39.0–52.0)
Hemoglobin: 11.4 g/dL — ABNORMAL LOW (ref 13.0–17.0)
MCH: 24.2 pg — ABNORMAL LOW (ref 26.0–34.0)
MCHC: 30.4 g/dL (ref 30.0–36.0)
MCV: 79.4 fL — ABNORMAL LOW (ref 80.0–100.0)
Platelets: 372 10*3/uL (ref 150–400)
RBC: 4.72 MIL/uL (ref 4.22–5.81)
RDW: 13.9 % (ref 11.5–15.5)
WBC: 7.6 10*3/uL (ref 4.0–10.5)
nRBC: 0 % (ref 0.0–0.2)

## 2022-12-06 LAB — URINALYSIS, ROUTINE W REFLEX MICROSCOPIC
Bilirubin Urine: NEGATIVE
Glucose, UA: NEGATIVE mg/dL
Hgb urine dipstick: NEGATIVE
Ketones, ur: 20 mg/dL — AB
Leukocytes,Ua: NEGATIVE
Nitrite: NEGATIVE
Protein, ur: NEGATIVE mg/dL
Specific Gravity, Urine: 1.021 (ref 1.005–1.030)
pH: 5 (ref 5.0–8.0)

## 2022-12-06 LAB — COMPREHENSIVE METABOLIC PANEL
ALT: 11 U/L (ref 0–44)
AST: 13 U/L — ABNORMAL LOW (ref 15–41)
Albumin: 3.3 g/dL — ABNORMAL LOW (ref 3.5–5.0)
Alkaline Phosphatase: 48 U/L (ref 38–126)
Anion gap: 12 (ref 5–15)
BUN: 10 mg/dL (ref 6–20)
CO2: 23 mmol/L (ref 22–32)
Calcium: 9 mg/dL (ref 8.9–10.3)
Chloride: 106 mmol/L (ref 98–111)
Creatinine, Ser: 0.96 mg/dL (ref 0.61–1.24)
GFR, Estimated: >60 mL/min (ref >60)
Glucose, Bld: 89 mg/dL (ref 70–99)
Potassium: 3.8 mmol/L (ref 3.5–5.1)
Sodium: 141 mmol/L (ref 135–145)
Total Bilirubin: 0.5 mg/dL (ref 0.3–1.2)
Total Protein: 7.7 g/dL (ref 6.5–8.1)

## 2022-12-06 LAB — LIPASE, BLOOD: Lipase: 24 U/L (ref 11–51)

## 2022-12-06 MED ORDER — SULFAMETHOXAZOLE-TRIMETHOPRIM 800-160 MG PO TABS: 1.0000 | ORAL_TABLET | Freq: Two times a day (BID) | ORAL | 0 refills | Status: AC

## 2022-12-06 NOTE — ED Provider Notes (Signed)
[place] EMERGENCY DEPARTMENT AT [facility] Provider Note   CSN: [id number] Arrival date & time: 12/06/22  1122     History  Chief Complaint  Patient presents with   Abdominal Pain    Adam Christian is a 42 y.o. male.  Is a 42 year old male with past medical history of hidradenitis suppurativa status post surgical removal of underarm and buttock sweat glands who presents for right lower quadrant abdominal pain.  Patient states that yesterday while at work he developed cramping intermittent right lower quadrant pain.  Pain persisted all day but went away when he slept.  It came back this morning which prompted today's ED visit.  At time of evaluation here the pain has subsided.  Denies fevers, chills, nausea vomiting diarrhea, dysuria, hematuria, history of kidney stones, history of pancreatitis, scrotal swelling or pain, pain or shortness of breath, or abdominal surgeries.  Another concern of his is a hidradenitis abscess on his right buttock present for several days, spontaneously draining.  Patient would like antibiotics to treat this.   The history is provided by the patient and medical records.       Home Medications Prior to Admission medications   Medication Sig Start Date End Date Taking? Authorizing Provider  sulfamethoxazole-trimethoprim (BACTRIM DS) 800-160 MG tablet Take 1 tablet by mouth 2 (two) times daily for 7 days. 12/06/22 12/13/22 Yes Karmen Stabs, MD  rivaroxaban (XARELTO) 20 MG TABS tablet Take 1 tablet (20 mg total) by mouth daily with supper. Take with food. Start taking after completion of starter pack. Patient not taking: Reported on 11/22/2022 08/22/22   Pervis Hocking B, RPH-CPP      Allergies    Shrimp [shellfish allergy]    Review of Systems   Review of Systems as per HPI above  Physical Exam Updated Vital Signs BP (!) 175/103   Pulse (!) 59   Temp 98.1 F (36.7 C)   Resp 17   Ht 5\' 6"  (1.676 m)   Wt 77 kg   SpO2 100%   BMI  27.40 kg/m  Physical Exam Constitutional:      Appearance: He is well-developed.  HENT:     Head: Normocephalic and atraumatic.     Mouth/Throat:     Mouth: Mucous membranes are moist.  Eyes:     Extraocular Movements: Extraocular movements intact.  Cardiovascular:     Rate and Rhythm: Normal rate and regular rhythm.     Heart sounds: Normal heart sounds. No murmur heard.    No friction rub. No gallop.  Pulmonary:     Effort: Pulmonary effort is normal.     Breath sounds: Normal breath sounds.  Abdominal:     General: Abdomen is flat. There is no distension.     Palpations: Abdomen is soft.     Tenderness: There is no abdominal tenderness. There is no guarding or rebound. Negative signs include Murphy's sign, Rovsing's sign and McBurney's sign.     Hernia: No hernia is present.  Genitourinary:    Penis: Normal.      Testes: Normal. Cremasteric reflex is present.     Comments: 3 cm linear area of fluctuance to the R inner buttock spontaneously draining. No crepitus Skin:    General: Skin is warm and dry.     Capillary Refill: Capillary refill takes less than 2 seconds.  Neurological:     General: No focal deficit present.     Mental Status: He is alert.     Cranial  Nerves: No cranial nerve deficit.     Motor: No weakness.     ED Results / Procedures / Treatments   Labs (all labs ordered are listed, but only abnormal results are displayed) Labs Reviewed  COMPREHENSIVE METABOLIC PANEL - Abnormal; Notable for the following components:      Result Value   Albumin 3.3 (*)    AST 13 (*)    All other components within normal limits  CBC - Abnormal; Notable for the following components:   Hemoglobin 11.4 (*)    HCT 37.5 (*)    MCV 79.4 (*)    MCH 24.2 (*)    All other components within normal limits  URINALYSIS, ROUTINE W REFLEX MICROSCOPIC - Abnormal; Notable for the following components:   Ketones, ur 20 (*)    All other components within normal limits  LIPASE, BLOOD     EKG None  Radiology No results found.  Procedures Procedures    Medications Ordered in ED Medications - No data to display  ED Course/ Medical Decision Making/ A&P    Click here for ABCD2, HEART and other calculatorsREFRESH Note before signing :1}                              Medical Decision Making Amount and/or Complexity of Data Reviewed Labs: ordered. Decision-making details documented in ED Course.  Risk OTC drugs. Prescription drug management. Decision regarding hospitalization.  42 year old male presents with abdominal pain in the right lower quadrant.  Differential considered includes appendicitis, kidney stone, UTI, testicular torsion, bowel obstruction, hepatitis diverticulitis pancreatitis, gas pain, constipation.  He is hemodynamically stable and entirely pain and symptom-free at the time of evaluation here.  He does not clinically appear dehydrated or ill.  His exam with no tenderness and reassuring GU exam described above is not consistent with acute surgical pathology, torsion, or likely any life-threatening condition.  He does have evidence of a hidradenitis abscess to his buttock which is already spontaneously draining, with no crepitus or concerning features for necrotizing infection though this was also considered.   For his abdominal pain labs were obtained and are notable for no leukocytosis, hemoglobin 11.4, normal lipase, normal lites renal function liver function, UA with no evidence of UTI and no evidence of hematuria that would suggest occult kidney stone.  Advanced imaging not indicated this encounter based on well appearance and reassuring exam and rationale described above, reassuring labs. I offered patient pain medicine and nausea medicine which he declined as he is entirely symptom-free. He was able to take PO in ED without issue.   For his hidradenitis abscess, I&D not indicated as already spontaneously draining.  Will discharge with p.o. Bactrim  and have him follow-up with his PCP. He is agreeable to this plan.  Prescription was sent to preferred pharmacy.  Strict and precautions discussed.  Discharged in stable condition         Final Clinical Impression(s) / ED Diagnoses Final diagnoses:  Right lower quadrant abdominal pain  Hidradenitis    Rx / DC Orders ED Discharge Orders          Ordered    sulfamethoxazole-trimethoprim (BACTRIM DS) 800-160 MG tablet  2 times daily        12/06/22 1700              Karmen Stabs, MD 12/06/22 1706    Glyn Ade, MD 12/06/22 1711    Glyn Ade, MD  12/06/22 1712  

## 2022-12-06 NOTE — ED Triage Notes (Signed)
Pt c/o RLQ pain that started yesterday. Pt states pain has been intermittent, worsens w/coughing. Pt denies fevers, nausea, vomiting, and diarrhea.  Pt has had shortness of breath, has had bronchitis recently. Pt has had constipation, last BM was yesterday.

## 2022-12-06 NOTE — ED Notes (Signed)
Awaiting patient from lobby 

## 2022-12-08 ENCOUNTER — Emergency Department (HOSPITAL_COMMUNITY)
Admission: EM | Admit: 2022-12-08 | Discharge: 2022-12-09 | Disposition: A | Discharge status: Home / Self Care | Payer: BC Managed Care – PPO | Attending: Emergency Medicine | Admitting: Emergency Medicine

## 2022-12-08 ENCOUNTER — Encounter (HOSPITAL_COMMUNITY): Payer: Self-pay

## 2022-12-08 DIAGNOSIS — R109 Unspecified abdominal pain: Secondary | ICD-10-CM | POA: Diagnosis present

## 2022-12-08 DIAGNOSIS — R1031 Right lower quadrant pain: Secondary | ICD-10-CM | POA: Diagnosis not present

## 2022-12-08 LAB — COMPREHENSIVE METABOLIC PANEL
ALT: 13 U/L (ref 0–44)
AST: 14 U/L — ABNORMAL LOW (ref 15–41)
Albumin: 3.6 g/dL (ref 3.5–5.0)
Alkaline Phosphatase: 59 U/L (ref 38–126)
Anion gap: 10 (ref 5–15)
BUN: 7 mg/dL (ref 6–20)
CO2: 23 mmol/L (ref 22–32)
Calcium: 9.1 mg/dL (ref 8.9–10.3)
Chloride: 104 mmol/L (ref 98–111)
Creatinine, Ser: 1.07 mg/dL (ref 0.61–1.24)
GFR, Estimated: >60 mL/min (ref >60)
Glucose, Bld: 92 mg/dL (ref 70–99)
Potassium: 3.4 mmol/L — ABNORMAL LOW (ref 3.5–5.1)
Sodium: 137 mmol/L (ref 135–145)
Total Bilirubin: 0.5 mg/dL (ref 0.3–1.2)
Total Protein: 8.1 g/dL (ref 6.5–8.1)

## 2022-12-08 LAB — CBC WITH DIFFERENTIAL/PLATELET
Abs Immature Granulocytes: 0.04 10*3/uL (ref 0.00–0.07)
Basophils Absolute: 0.1 10*3/uL (ref 0.0–0.1)
Basophils Relative: 1 %
Eosinophils Absolute: 0.1 10*3/uL (ref 0.0–0.5)
Eosinophils Relative: 0 %
HCT: 36.9 % — ABNORMAL LOW (ref 39.0–52.0)
Hemoglobin: 11.7 g/dL — ABNORMAL LOW (ref 13.0–17.0)
Immature Granulocytes: 0 %
Lymphocytes Relative: 19 %
Lymphs Abs: 2.2 10*3/uL (ref 0.7–4.0)
MCH: 25.1 pg — ABNORMAL LOW (ref 26.0–34.0)
MCHC: 31.7 g/dL (ref 30.0–36.0)
MCV: 79.2 fL — ABNORMAL LOW (ref 80.0–100.0)
Monocytes Absolute: 0.8 10*3/uL (ref 0.1–1.0)
Monocytes Relative: 7 %
Neutro Abs: 8.5 10*3/uL — ABNORMAL HIGH (ref 1.7–7.7)
Neutrophils Relative %: 73 %
Platelets: 358 10*3/uL (ref 150–400)
RBC: 4.66 MIL/uL (ref 4.22–5.81)
RDW: 13.8 % (ref 11.5–15.5)
WBC: 11.7 10*3/uL — ABNORMAL HIGH (ref 4.0–10.5)
nRBC: 0 % (ref 0.0–0.2)

## 2022-12-08 LAB — URINALYSIS, W/ REFLEX TO CULTURE (INFECTION SUSPECTED)
Bacteria, UA: NONE SEEN
Bilirubin Urine: NEGATIVE
Glucose, UA: NEGATIVE mg/dL
Hgb urine dipstick: NEGATIVE
Ketones, ur: 20 mg/dL — AB
Leukocytes,Ua: NEGATIVE
Nitrite: NEGATIVE
Protein, ur: NEGATIVE mg/dL
Specific Gravity, Urine: 1.016 (ref 1.005–1.030)
pH: 6 (ref 5.0–8.0)

## 2022-12-08 LAB — LIPASE, BLOOD: Lipase: 27 U/L (ref 11–51)

## 2022-12-08 NOTE — ED Triage Notes (Signed)
Pt presents with c/o right lower quadrant abdominal pain that wraps around to his right flank area. Pt reports the pain will come and go and is sharp in nature.

## 2022-12-08 NOTE — ED Provider Triage Note (Signed)
Emergency Medicine Provider Triage Evaluation Note  Vladislav Wetzell , a 42 y.o. male  was evaluated in triage.  Pt complains of rlq pain. Intermittent. [facility]en [facility] No imaging. Pain now worsening. Hx of PE. Missed some doses. No cp, sob, upper back pain. No pain to scrotum  Review of Systems  Positive: Rlq pain Negative: Fever, emesis  Physical Exam  BP (!) 177/112 (BP Location: Right Arm)   Pulse 87   Temp 99 F (37.2 C) (Oral)   Resp 18   Ht 5\' 6"  (1.676 m)   Wt 77.1 kg   SpO2 100%   BMI 27.44 kg/m  Gen:   Awake, no distress   Resp:  Normal effort  MSK:   Moves extremities without difficulty  ABD:  RLQ tenderness, lower back tenderness Other:    Medical Decision Making  Medically screening exam initiated at 7:08 PM.  Appropriate orders placed.  Kaicen Mccune was informed that the remainder of the evaluation will be completed by another provider, this initial triage assessment does not replace that evaluation, and the importance of remaining in the ED until their evaluation is complete.  Abd pain   Rosemary Pentecost A, PA-C 12/08/22 1909

## 2022-12-09 ENCOUNTER — Emergency Department (HOSPITAL_COMMUNITY): Payer: BC Managed Care – PPO

## 2022-12-09 MED ORDER — IOHEXOL 300 MG/ML  SOLN
100.0000 mL | Freq: Once | INTRAMUSCULAR | Status: AC | PRN
  Administered 2022-12-09: 100 mL via INTRAVENOUS

## 2022-12-09 MED ORDER — DICYCLOMINE HCL 20 MG PO TABS: 20.0000 mg | ORAL_TABLET | Freq: Three times a day (TID) | ORAL | 0 refills | Status: AC | PRN

## 2022-12-09 NOTE — ED Provider Notes (Signed)
[place] EMERGENCY DEPARTMENT AT [facility] Provider Note   CSN: [id number] Arrival date & time: 12/08/22  4098     History  Chief Complaint  Patient presents with   Abdominal Pain    Adam Christian is a 42 y.o. male.  Patient presents to the emergency department for evaluation of right-sided abdominal pain.  Patient reports that pain has been intermittent for 3 days.  It worsened tonight.  Patient does report that he was seen at [facility] yesterday, had lab work.  No vomiting, diarrhea, constipation.  No fever.       Home Medications Prior to Admission medications   Medication Sig Start Date End Date Taking? Authorizing Provider  dicyclomine (BENTYL) 20 MG tablet Take 1 tablet (20 mg total) by mouth 3 (three) times daily as needed (abdominal pain). 12/09/22  Yes Shabana Armentrout, Canary Brim, MD  rivaroxaban (XARELTO) 20 MG TABS tablet Take 1 tablet (20 mg total) by mouth daily with supper. Take with food. Start taking after completion of starter pack. Patient not taking: Reported on 11/22/2022 08/22/22   Pervis Hocking B, RPH-CPP  sulfamethoxazole-trimethoprim (BACTRIM DS) 800-160 MG tablet Take 1 tablet by mouth 2 (two) times daily for 7 days. 12/06/22 12/13/22  Karmen Stabs, MD      Allergies    Shrimp [shellfish allergy]    Review of Systems   Review of Systems  Physical Exam Updated Vital Signs BP 126/89   Pulse 91   Temp 98.1 F (36.7 C)   Resp 16   Ht 5\' 6"  (1.676 m)   Wt 77.1 kg   SpO2 100%   BMI 27.44 kg/m  Physical Exam Vitals and nursing note reviewed.  Constitutional:      General: He is not in acute distress.    Appearance: He is well-developed.  HENT:     Head: Normocephalic and atraumatic.     Mouth/Throat:     Mouth: Mucous membranes are moist.  Eyes:     General: Vision grossly intact. Gaze aligned appropriately.     Extraocular Movements: Extraocular movements intact.     Conjunctiva/sclera: Conjunctivae normal.   Cardiovascular:     Rate and Rhythm: Normal rate and regular rhythm.     Pulses: Normal pulses.     Heart sounds: Normal heart sounds, S1 normal and S2 normal. No murmur heard.    No friction rub. No gallop.  Pulmonary:     Effort: Pulmonary effort is normal. No respiratory distress.     Breath sounds: Normal breath sounds.  Abdominal:     Palpations: Abdomen is soft.     Tenderness: There is no abdominal tenderness. There is no guarding or rebound.     Hernia: No hernia is present.  Musculoskeletal:        General: No swelling.     Cervical back: Full passive range of motion without pain, normal range of motion and neck supple. No pain with movement, spinous process tenderness or muscular tenderness. Normal range of motion.     Right lower leg: No edema.     Left lower leg: No edema.  Skin:    General: Skin is warm and dry.     Capillary Refill: Capillary refill takes less than 2 seconds.     Findings: No ecchymosis, erythema, lesion or wound.  Neurological:     Mental Status: He is alert and oriented to person, place, and time.     GCS: GCS eye subscore is 4. GCS verbal subscore  is 5. GCS motor subscore is 6.     Cranial Nerves: Cranial nerves 2-12 are intact.     Sensory: Sensation is intact.     Motor: Motor function is intact. No weakness or abnormal muscle tone.     Coordination: Coordination is intact.  Psychiatric:        Mood and Affect: Mood normal.        Speech: Speech normal.        Behavior: Behavior normal.     ED Results / Procedures / Treatments   Labs (all labs ordered are listed, but only abnormal results are displayed) Labs Reviewed  CBC WITH DIFFERENTIAL/PLATELET - Abnormal; Notable for the following components:      Result Value   WBC 11.7 (*)    Hemoglobin 11.7 (*)    HCT 36.9 (*)    MCV 79.2 (*)    MCH 25.1 (*)    Neutro Abs 8.5 (*)    All other components within normal limits  COMPREHENSIVE METABOLIC PANEL - Abnormal; Notable for the  following components:   Potassium 3.4 (*)    AST 14 (*)    All other components within normal limits  URINALYSIS, W/ REFLEX TO CULTURE (INFECTION SUSPECTED) - Abnormal; Notable for the following components:   Ketones, ur 20 (*)    All other components within normal limits  LIPASE, BLOOD    EKG None  Radiology CT ABDOMEN PELVIS W CONTRAST  Result Date: 12/09/2022 CLINICAL DATA:  Right lower quadrant pain wrapping around to the right flank. EXAM: CT ABDOMEN AND PELVIS WITH CONTRAST TECHNIQUE: Multidetector CT imaging of the abdomen and pelvis was performed using the standard protocol following bolus administration of intravenous contrast. RADIATION DOSE REDUCTION: This exam was performed according to the departmental dose-optimization program which includes automated exposure control, adjustment of the mA and/or kV according to patient size and/or use of iterative reconstruction technique. CONTRAST:  OMNIPAQUE IOHEXOL 300 MG/ML  SOLN COMPARISON:  CT 12/03/2006 FINDINGS: Lower chest: No acute abnormality. Hepatobiliary: Unremarkable liver. Normal gallbladder. No biliary dilation. Pancreas: Unremarkable. Spleen: Unremarkable. Adrenals/Urinary Tract: Left adrenalectomy and nephrectomy. Normal right adrenal gland. No urinary calculi or hydronephrosis. Bladder is unremarkable. Stomach/Bowel: Normal caliber large and small bowel. No bowel wall thickening. The appendix is normal.Stomach is within normal limits. Soft tissue thickening about the anus with enhancing soft tissue tract extending posterior along the right gluteal cleft. Skin thickening along the gluteal cleft. Small 1.5 cm peripherally enhancing fluid collections along the posteromedial right buttock compatible with small abscesses. Vascular/Lymphatic: No significant vascular findings are present. No enlarged abdominal or pelvic lymph nodes. Reproductive: Unremarkable. Other: No free intraperitoneal fluid or air. Musculoskeletal: No acute  fracture. IMPRESSION: 1. 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 Electronically Signed   By: Minerva Fester M.D.   On: 12/09/2022 02:20    Procedures Procedures    Medications Ordered in ED Medications  iohexol (OMNIPAQUE) 300 MG/ML solution 100 mL (100 mLs Intravenous Contrast Given 12/09/22 0156)    ED Course/ Medical Decision Making/ A&P                                 Medical Decision Making  Differential Diagnosis considered includes, but not limited to: Appendicitis; colitis; diverticulitis; bowel obstruction; cystitis; nephrolithiasis; pyelonephritis.  Patient experiencing intermittent right lower abdomen and flank pain for several days.  Pain not present continuously, he  has not identified anything that causes it.  When its present it is a sharp pain.  No associated features.  Lab work unremarkable.  CT scan does not show acute problems.  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  Patient reports that this has not changed from his chronic drainage and findings, CT findings apparently at baseline.        Final Clinical Impression(s) / ED Diagnoses Final diagnoses:  Right lower quadrant abdominal pain    Rx / DC Orders ED Discharge Orders          Ordered    dicyclomine (BENTYL) 20 MG tablet  3 times daily PRN        12/09/22 0309              Gilda Crease, MD 12/09/22 [id number]

## 2022-12-27 ENCOUNTER — Encounter: Payer: Self-pay | Admitting: *Deleted

## 2022-12-27 ENCOUNTER — Inpatient Hospital Stay: Payer: BC Managed Care – PPO | Attending: Hematology and Oncology

## 2022-12-27 DIAGNOSIS — Z86718 Personal history of other venous thrombosis and embolism: Secondary | ICD-10-CM | POA: Insufficient documentation

## 2022-12-27 DIAGNOSIS — Z7901 Long term (current) use of anticoagulants: Secondary | ICD-10-CM | POA: Diagnosis not present

## 2022-12-27 DIAGNOSIS — I82412 Acute embolism and thrombosis of left femoral vein: Secondary | ICD-10-CM

## 2022-12-28 LAB — LUPUS ANTICOAGULANT PANEL
DRVVT: 48.1 s — ABNORMAL HIGH (ref 0.0–47.0)
PTT Lupus Anticoagulant: 32.9 s (ref 0.0–43.5)

## 2022-12-28 LAB — DRVVT MIX: dRVVT Mix: 40.5 s — ABNORMAL HIGH (ref 0.0–40.4)

## 2022-12-28 LAB — DRVVT CONFIRM: dRVVT Confirm: 1.1 {ratio} (ref 0.8–1.2)

## 2022-12-30 ENCOUNTER — Inpatient Hospital Stay: Payer: BC Managed Care – PPO | Admitting: Hematology and Oncology

## 2022-12-30 NOTE — Assessment & Plan Note (Signed)
08/16/2022: Acute DVT left common femoral vein, left femoral vein, left proximal profunda vein, left popliteal vein, left peroneal veins  Hypercoagulability panel: 09/19/2022: Protein C 67%, protein S 107%, Antithrombin III 106%, beta-2 glycoprotein 1 antibodies negative, cardiolipin antibodies negative, factor V Leiden: Not detected lupus anticoagulant: Positive, factor VIII 149%, prothrombin gene mutation pending   Current treatment: Xarelto 12/27/2022: Lupus anticoagulant testing: Negative Recommendation: Continuation of anticoagulation for 1 year and discuss pros and cons of long-term anticoagulation

## 2022-12-30 NOTE — Assessment & Plan Note (Deleted)
08/16/2022: Acute DVT left common femoral vein, left femoral vein, left proximal profunda vein, left popliteal vein, left peroneal veins  Hypercoagulability panel: 09/19/2022: Protein C 67%, protein S 107%, Antithrombin III 106%, beta-2 glycoprotein 1 antibodies negative, cardiolipin antibodies negative, factor V Leiden: Not detected lupus anticoagulant: Positive, factor VIII 149%, prothrombin gene mutation pending   Current treatment: Xarelto 12/27/2022: Lupus anticoagulant testing: Negative Recommendation: Continuation of anticoagulation for 1 year and discuss pros and cons of long-term anticoagulation

## 2022-12-31 ENCOUNTER — Other Ambulatory Visit: Payer: Self-pay

## 2022-12-31 ENCOUNTER — Inpatient Hospital Stay: Payer: BC Managed Care – PPO | Admitting: Hematology and Oncology

## 2022-12-31 DIAGNOSIS — I82412 Acute embolism and thrombosis of left femoral vein: Secondary | ICD-10-CM

## 2022-12-31 NOTE — Progress Notes (Signed)
HEMATOLOGY-ONCOLOGY TELEPHONE VISIT PROGRESS NOTE  I connected with our patient on 12/31/22 at  7:45 AM EST by telephone and verified that I am speaking with the correct person using two identifiers.  I discussed the limitations, risks, security and privacy concerns of performing an evaluation and management service by telephone and the availability of in person appointments.  I also discussed with the patient that there may be a patient responsible charge related to this service. The patient expressed understanding and agreed to proceed.   History of Present Illness:   History of Present Illness   The patient, with a history of blood clots, presents for a follow-up visit after a lupus anticoagulant test. He was previously found to have an antibody in his blood that could cause blood clots and was started on blood thinners in July. The recent lupus anticoagulant test, however, did not detect the presence of the lupus anticoagulant, suggesting the initial test may have been a false positive.  Despite the negative test, the patient reports intermittent swelling in his leg, particularly after work. The swelling resolves with rest and elevation. This suggests that there may still be some ongoing issues related to the blood clot.        REVIEW OF SYSTEMS:   Constitutional: Denies fevers, chills or abnormal weight loss Complains of leg swelling since he has to be on his feet all day. All other systems were reviewed with the patient and are negative. Observations/Objective:     Assessment Plan:  Acute deep vein thrombosis (DVT) of femoral vein of left lower extremity (HCC) 08/16/2022: Acute DVT left common femoral vein, left femoral vein, left proximal profunda vein, left popliteal vein, left peroneal veins  Hypercoagulability panel: 09/19/2022: Protein C 67%, protein S 107%, Antithrombin III 106%, beta-2 glycoprotein 1 antibodies negative, cardiolipin antibodies negative, factor V Leiden: Not detected  lupus anticoagulant: Positive, factor VIII 149%, prothrombin gene mutation pending   Current treatment: Xarelto started July 2024 12/27/2022: Lupus anticoagulant testing: Negative Recommendation:  We will check ultrasound of his left leg to evaluate for the presence of DVT. Telephone call after this ultrasound to discuss duration of anticoagulation.  Patient tells me that he still has symptoms of leg swelling throughout the day which gets better when he elevates his leg. I suspect that he might need anticoagulation at least for a year.  We will discuss this after the ultrasound and make a final decision. - I discussed the assessment and treatment plan with the patient. The patient was provided an opportunity to ask questions and all were answered. The patient agreed with the plan and demonstrated an understanding of the instructions. The patient was advised to call back or seek an in-person evaluation if the symptoms worsen or if the condition fails to improve as anticipated.   I provided 12 minutes of non-face-to-face time during this encounter.  This includes time for charting and coordination of care   Tamsen Meek, MD

## 2023-01-30 ENCOUNTER — Ambulatory Visit (HOSPITAL_COMMUNITY)
Admission: RE | Admit: 2023-01-30 | Discharge: 2023-01-30 | Disposition: A | Discharge status: Home / Self Care | Payer: BC Managed Care – PPO | Source: Ambulatory Visit | Attending: Hematology and Oncology | Admitting: Hematology and Oncology

## 2023-01-30 DIAGNOSIS — I82412 Acute embolism and thrombosis of left femoral vein: Secondary | ICD-10-CM | POA: Insufficient documentation

## 2023-01-30 NOTE — Progress Notes (Signed)
Left lower extremity venous duplex has been completed. Preliminary results can be found in CV Proc through chart review.  Results were given to Vernona Rieger at Dr. Earmon [place] office.  01/30/23 1:07 PM Olen Cordial RVT

## 2023-02-20 ENCOUNTER — Inpatient Hospital Stay: Payer: BC Managed Care – PPO | Attending: Hematology and Oncology | Admitting: Hematology and Oncology

## 2023-02-20 DIAGNOSIS — I82412 Acute embolism and thrombosis of left femoral vein: Secondary | ICD-10-CM | POA: Diagnosis not present

## 2023-02-20 NOTE — Assessment & Plan Note (Signed)
 08/16/2022: Acute DVT left common femoral vein, left femoral vein, left proximal profunda vein, left popliteal vein, left peroneal veins  Hypercoagulability panel: 09/19/2022: Protein C 67%, protein S 107%, Antithrombin III  106%, beta-2  glycoprotein 1 antibodies negative, cardiolipin antibodies negative, factor V Leiden: Not detected lupus anticoagulant: Positive, factor VIII 149%, prothrombin gene mutation pending   Current treatment: Xarelto  started July 2024 12/27/2022: Lupus anticoagulant testing: Negative Recommendation:  01/30/2023: Bilateral leg ultrasound: No evidence of DVT I discussed with the patient about symptoms of leg pain or swelling.
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
## 2024-02-27 ENCOUNTER — Ambulatory Visit
Admission: EM | Admit: 2024-02-27 | Discharge: 2024-02-27 | Disposition: A | Discharge status: Home / Self Care | Attending: Physician Assistant | Admitting: Physician Assistant

## 2024-02-27 ENCOUNTER — Encounter: Payer: Self-pay | Admitting: *Deleted

## 2024-02-27 DIAGNOSIS — L0231 Cutaneous abscess of buttock: Secondary | ICD-10-CM

## 2024-02-27 MED ORDER — DOXYCYCLINE HYCLATE 100 MG PO CAPS: 100.0000 mg | ORAL_CAPSULE | Freq: Two times a day (BID) | ORAL | 0 refills | Status: AC

## 2024-02-27 MED ORDER — IBUPROFEN 800 MG PO TABS: 800.0000 mg | ORAL_TABLET | Freq: Three times a day (TID) | ORAL | 0 refills | Status: AC | PRN

## 2024-02-27 NOTE — ED Triage Notes (Signed)
 Pt reports history of HS. States has 3 connected lesions that are draining on right buttock- flare up started about a month ago. States draining blood with the drainage. Requesting antibiotics and ibuprofen 

## 2024-02-27 NOTE — Discharge Instructions (Signed)
 Return if any problems.

## 2024-02-27 NOTE — ED Provider Notes (Signed)
 " [facility]    CSN: [id number] Arrival date & time: 02/27/24  1240      History   Chief Complaint Chief Complaint  Patient presents with   Abscess    HPI Adam Christian is a 44 y.o. male.   Pt complains of an exacerbation of hidradenitis suppurativa. Pt request an antibiotic and ibuprofen .  Pt reports this normally works for him     The history is provided by the patient. No language interpreter was used.  Abscess Size:  3cm Abscess quality: draining   Progression:  Worsening Chronicity:  New Relieved by:  Nothing   Past Medical History:  Diagnosis Date   Cyst 44 years old was first occurance   Pt has multiple cysts: L axila, groing, gluteous, scrotum   Depression    DVT (deep venous thrombosis) (HCC)    Hydradenitis    Peripheral vascular disease    right leg blood clot   Seizures (HCC)    as a child    Patient Active Problem List   Diagnosis Date Noted   Acute deep vein thrombosis (DVT) of femoral vein of left lower extremity (HCC) 08/22/2022   Hydradenitis 05/05/2013   Depression, major 12/24/2010   Acute thromboembolism of deep veins of lower extremity (HCC) 03/10/2007    Past Surgical History:  Procedure Laterality Date   boil     HYDRADENITIS EXCISION N/A 05/09/2020   Procedure: DEBRIDEMENT OF RIGHT GROIN AND LEFT BUTTOCK HIDRADENITIS;  Surgeon: Vanderbilt Ned, MD;  Location: [facility] OR;  Service: General;  Laterality: N/A;   KIDNEY DONATION     KIDNEY SURGERY  March 29, 2003   patient gave mother one of his kidneys   TONSILLECTOMY     TONSILLECTOMY         Home Medications    Prior to Admission medications  Medication Sig Start Date End Date Taking? Authorizing Provider  doxycycline  (VIBRAMYCIN ) 100 MG capsule Take 1 capsule (100 mg total) by mouth 2 (two) times daily. 02/27/24  Yes Datron Brakebill K, PA-C  ibuprofen  (ADVIL ) 800 MG tablet Take 1 tablet (800 mg total) by mouth every 8 (eight) hours as needed. 02/27/24  Yes Algie Westry,  Tyasia Packard K, PA-C  dicyclomine  (BENTYL ) 20 MG tablet Take 1 tablet (20 mg total) by mouth 3 (three) times daily as needed (abdominal pain). Patient not taking: Reported on 02/27/2024 12/09/22   Haze Lonni PARAS, MD  rivaroxaban  (XARELTO ) 20 MG TABS tablet Take 1 tablet (20 mg total) by mouth daily with supper. Take with food. Start taking after completion of starter pack. Patient not taking: Reported on 11/22/2022 08/22/22   Barbarann Lum NOVAK, RPH-CPP    Family History Family History  Problem Relation Age of Onset   Kidney failure Mother    Hypertension Mother    Hypertension Sister     Social History Social History[1]   Allergies   Shrimp [shellfish allergy]   Review of Systems Review of Systems  All other systems reviewed and are negative.    Physical Exam Triage Vital Signs ED Triage Vitals [02/27/24 1334]  Encounter Vitals Group     BP      Girls Systolic BP Percentile      Girls Diastolic BP Percentile      Boys Systolic BP Percentile      Boys Diastolic BP Percentile      Pulse      Resp      Temp      Temp src  SpO2      Weight      Height      Head Circumference      Peak Flow      Pain Score 5     Pain Loc      Pain Education      Exclude from Growth Chart    No data found.  Updated Vital Signs There were no vitals taken for this visit.  Visual Acuity Right Eye Distance:   Left Eye Distance:   Bilateral Distance:    Right Eye Near:   Left Eye Near:    Bilateral Near:     Physical Exam Vitals and nursing note reviewed.  Constitutional:      Appearance: He is well-developed.  HENT:     Head: Normocephalic.  Cardiovascular:     Rate and Rhythm: Normal rate.  Pulmonary:     Effort: Pulmonary effort is normal.  Abdominal:     General: There is no distension.  Musculoskeletal:        General: Normal range of motion.     Cervical back: Normal range of motion.     Comments: Swollen tender area right buttock,  oozing purulent  drainage   Skin:    General: Skin is warm.  Neurological:     General: No focal deficit present.     Mental Status: He is alert and oriented to person, place, and time.      UC Treatments / Results  Labs (all labs ordered are listed, but only abnormal results are displayed) Labs Reviewed - No data to display  EKG   Radiology No results found.  Procedures Procedures (including critical care time)  Medications Ordered in UC Medications - No data to display  Initial Impression / Assessment and Plan / UC Course  I have reviewed the triage vital signs and the nursing notes.  Pertinent labs & imaging results that were available during my care of the patient were reviewed by me and considered in my medical decision making (see chart for details).     Pt given rx for doxycycline  and ibuprofen .   Final Clinical Impressions(s) / UC Diagnoses   Final diagnoses:  Abscess of buttock, right     Discharge Instructions      Return if any problems.     ED Prescriptions     Medication Sig Dispense Auth. Provider   doxycycline  (VIBRAMYCIN ) 100 MG capsule Take 1 capsule (100 mg total) by mouth 2 (two) times daily. 20 capsule Montavius Subramaniam K, PA-C   ibuprofen  (ADVIL ) 800 MG tablet Take 1 tablet (800 mg total) by mouth every 8 (eight) hours as needed. 30 tablet Edlyn Rosenburg K, PA-C      PDMP not reviewed this encounter. An After Visit Summary was printed and given to the patient.        [1]  Social History Tobacco Use   Smoking status: Some Days    Current packs/day: 0.00    Average packs/day: 0.5 packs/day    Types: Cigarettes    Last attempt to quit: 04/27/2020    Years since quitting: 3.8   Smokeless tobacco: Never  Vaping Use   Vaping status: Never Used  Substance Use Topics   Alcohol use: Yes    Comment: beer occasionally   Drug use: Yes    Frequency: 14.0 times per week    Types: Marijuana    Comment: occasionally     [place] Sonny POUR,  PA-C 02/27/24 1354  "
